# Patient Record
Sex: Male | Born: 2000 | Race: Black or African American | Hispanic: No | Marital: Single | Smoking: Former smoker
Health system: Southern US, Community
[De-identification: ages and names within clinical notes are randomized; demographics above are authoritative.]

## PROBLEM LIST (undated history)

## (undated) ENCOUNTER — Ambulatory Visit: Source: Home / Self Care

## (undated) ENCOUNTER — Ambulatory Visit: Admission: EM | Source: Home / Self Care

---
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
## 2018-04-07 ENCOUNTER — Encounter (HOSPITAL_COMMUNITY): Payer: Self-pay | Admitting: Emergency Medicine

## 2018-04-07 ENCOUNTER — Emergency Department (HOSPITAL_COMMUNITY)
Admission: EM | Admit: 2018-04-07 | Discharge: 2018-04-07 | Disposition: A | Discharge status: Home / Self Care | Payer: Medicaid Other | Attending: Emergency Medicine | Admitting: Emergency Medicine

## 2018-04-07 ENCOUNTER — Emergency Department (HOSPITAL_COMMUNITY): Payer: Medicaid Other

## 2018-04-07 DIAGNOSIS — Y999 Unspecified external cause status: Secondary | ICD-10-CM | POA: Diagnosis not present

## 2018-04-07 DIAGNOSIS — S6991XA Unspecified injury of right wrist, hand and finger(s), initial encounter: Secondary | ICD-10-CM | POA: Diagnosis present

## 2018-04-07 DIAGNOSIS — S62346A Nondisplaced fracture of base of fifth metacarpal bone, right hand, initial encounter for closed fracture: Secondary | ICD-10-CM | POA: Insufficient documentation

## 2018-04-07 DIAGNOSIS — S62339A Displaced fracture of neck of unspecified metacarpal bone, initial encounter for closed fracture: Secondary | ICD-10-CM

## 2018-04-07 DIAGNOSIS — Y929 Unspecified place or not applicable: Secondary | ICD-10-CM | POA: Diagnosis not present

## 2018-04-07 DIAGNOSIS — Y9389 Activity, other specified: Secondary | ICD-10-CM | POA: Insufficient documentation

## 2018-04-07 DIAGNOSIS — F17210 Nicotine dependence, cigarettes, uncomplicated: Secondary | ICD-10-CM | POA: Insufficient documentation

## 2018-04-07 DIAGNOSIS — W2209XA Striking against other stationary object, initial encounter: Secondary | ICD-10-CM | POA: Insufficient documentation

## 2018-04-07 MED ORDER — HYDROCODONE-ACETAMINOPHEN 5-325 MG PO TABS: 1.0000 | ORAL_TABLET | Freq: Two times a day (BID) | ORAL | 0 refills | Status: DC

## 2018-04-07 MED ORDER — IBUPROFEN 400 MG PO TABS
400.0000 mg | ORAL_TABLET | Freq: Once | ORAL | Status: AC | PRN
Start: 2018-04-07 — End: 2018-04-07
  Administered 2018-04-07: 400 mg via ORAL
  Filled 2018-04-07: qty 1

## 2018-04-07 MED ORDER — NAPROXEN 500 MG PO TABS: 500.0000 mg | ORAL_TABLET | Freq: Two times a day (BID) | ORAL | 0 refills | Status: DC

## 2018-04-07 NOTE — ED Triage Notes (Addendum)
Pt states that yesterday he punched a locker, injuring his right hand. Swelling and redness noted to the right hand, pulses 3+. Pt took tylenol last night.

## 2018-04-07 NOTE — Discharge Instructions (Addendum)
Ice to hand (over splint) 30 minutes 4xday  No pushing, pulling, lifting with right hand.  Keep splint intact and dry.

## 2018-04-07 NOTE — Consult Note (Addendum)
Reason for Consult:[facility] Referring Physician: J Hedges  Ryan Esparza is an 17 y.o. male.  HPI: Ryan Esparza was at school and punched a locker with his right hand. About 10 minutes later it started hurting. He tried to tough it out but when it was still hurting this morning he came to the ED for evaluation. X-rays showed a 5th MC base fx and hand surgery was consulted. He c/o localized pain and some numbness in the little finger. He is [name] and works at a bar.  History reviewed. No pertinent past medical history.  No past surgical history on file.  Family History  Problem Relation Age of Onset  . Obesity Father     Social History:  reports that he has been smoking MJ (no tobacco).  He has never used smokeless tobacco. He reports that he does not drink alcohol.  Allergies: No Known Allergies  Medications: I have reviewed the patient's current medications.  No results found for this or any previous visit (from the past 48 hour(s)).  Dg Hand Complete Right  Result Date: 04/07/2018 CLINICAL DATA:  Punching injury.  Pain of the metacarpal region. EXAM: RIGHT HAND - COMPLETE 3+ VIEW COMPARISON:  None. FINDINGS: Fracture of the base of the fifth metacarpal, intra-articular. No third or fourth metacarpal fracture identified. IMPRESSION: Fracture the base of the fifth metacarpal. Electronically Signed   By: Ryan Esparza M.D.   On: 04/07/2018 10:36    Review of Systems  Constitutional: Negative for weight loss.  HENT: Negative for ear discharge, ear pain, hearing loss and tinnitus.   Eyes: Negative for blurred vision, double vision, photophobia and pain.  Respiratory: Negative for cough, sputum production and shortness of breath.   Cardiovascular: Negative for chest pain.  Gastrointestinal: Negative for abdominal pain, nausea and vomiting.  Genitourinary: Negative for dysuria, flank pain, frequency and urgency.  Musculoskeletal: Positive for joint pain (Right hand). Negative for back pain,  falls, myalgias and neck pain.  Neurological: Negative for dizziness, tingling, sensory change, focal weakness, loss of consciousness and headaches.  Endo/Heme/Allergies: Does not bruise/bleed easily.  Psychiatric/Behavioral: Negative for depression, memory loss and substance abuse. The patient is not nervous/anxious.    Blood pressure 116/66, pulse (!) 56, temperature 98.3 F (36.8 C), temperature source Oral, resp. rate 18, height 5\' 5"  (1.651 m), weight 56.7 kg (125 lb), SpO2 100 %. Physical Exam  Constitutional: He appears well-developed and well-nourished. No distress.  HENT:  Head: Normocephalic and atraumatic.  Eyes: Conjunctivae are normal. Right eye exhibits no discharge. Left eye exhibits no discharge. No scleral icterus.  Neck: Normal range of motion.  Cardiovascular: Normal rate and regular rhythm.  Respiratory: Effort normal. No respiratory distress.  Musculoskeletal:  Right shoulder, elbow, wrist, digits- no skin wounds, severe TTP base of 5th MC, mild overlying erythema, no instability, no blocks to motion  Sens  Ax/R/M intact, U paresthetic  Mot   Ax/ R/ PIN/ M/ AIN/ U intact  Rad 2+  Neurological: He is alert.  Skin: Skin is warm and dry. He is not diaphoretic.  Psychiatric: He has a normal mood and affect. His behavior is normal.    Assessment/Plan: Right 5th MC fx -- I suspect the N/T is just due to the swelling and should resolve with time. Splint and f/u with Dr. Dion Esparza in office in 7-10d.     Ryan CaldronMichael J. Jeffery, PA-C Orthopedic Surgery [phone] 04/07/2018, 1:03 PM   Films reviewed, agree with above.  Will see as an outpatient.  Not likely to need surgery.

## 2018-04-07 NOTE — ED Provider Notes (Signed)
[facility] EMERGENCY DEPARTMENT Provider Note   CSN: [id number] Arrival date & time: 04/07/18  4098     History   Chief Complaint Chief Complaint  Patient presents with  . Hand Injury    HPI Jahaan Vanwagner is a 17 y.o. male.  HPI   17 year old male presents with hand pain.  Patient reports yesterday he punched a locker causing pain to the base of his fifth metacarpal.  He notes associated swelling.  He reports some decreased sensation in the fourth and fifth fingers, reduced range of motion secondary to pain.  Patient denies any other injuries.  He notes he is right-hand dominant currently in high school and working.  Patient received ibuprofen prior to my evaluation which improved his symptoms.    History reviewed. No pertinent past medical history.  Patient Active Problem List   Diagnosis Date Noted  . Substance use disorder 09/11/2017  . Bereavement 09/11/2017  . Acne 09/13/2013    No past surgical history on file.      Home Medications    Prior to Admission medications   Medication Sig Start Date End Date Taking? Authorizing Provider  HYDROcodone-acetaminophen (NORCO/VICODIN) 5-325 MG tablet Take 1 tablet by mouth 2 (two) times daily. 04/07/18   Greyson Peavy, Tinnie Gens, PA-C  hydrocortisone (ANUSOL-HC) 2.5 % rectal cream Apply rectally 2 times daily 11/10/17   Alazar Cherian, Tinnie Gens, PA-C  naproxen (NAPROSYN) 500 MG tablet Take 1 tablet (500 mg total) by mouth 2 (two) times daily. 04/07/18   Lorissa Kishbaugh, Tinnie Gens, PA-C  tretinoin (RETIN-A) 0.025 % cream Apply topically at bedtime. Patient not taking: Reported on 12/28/2014 09/13/13   Brynda Greathouse, MD    Family History Family History  Problem Relation Age of Onset  . Obesity Father     Social History Social History   Tobacco Use  . Smoking status: Current Every Day Smoker  . Smokeless tobacco: Never Used  Substance Use Topics  . Alcohol use: No  . Drug use: Not on file     Allergies   Patient has no  known allergies.   Review of Systems Review of Systems   Physical Exam Updated Vital Signs BP (!) 127/59 (BP Location: Left Arm)   Pulse 86   Temp 98.4 F (36.9 C) (Oral)   Resp 20   Ht 5\' 5"  (1.651 m)   Wt 56.7 kg (125 lb)   SpO2 98%   BMI 20.80 kg/m   Physical Exam  Constitutional: He is oriented to person, place, and time. He appears well-developed and well-nourished.  HENT:  Head: Normocephalic and atraumatic.  Eyes: Pupils are equal, round, and reactive to light. Conjunctivae are normal. Right eye exhibits no discharge. Left eye exhibits no discharge. No scleral icterus.  Neck: Normal range of motion. No JVD present. No tracheal deviation present.  Pulmonary/Chest: Effort normal. No stridor.  Musculoskeletal:  Swelling noted at the dorsal proximal fifth metacarpal-decreased sensation to the fourth and fifth metacarpals, strength is intact but reduced in the fourth and fifth reduced range of motion secondary to pain-remainder of hand atraumatic with intact sensory motor function  Neurological: He is alert and oriented to person, place, and time. Coordination normal.  Psychiatric: He has a normal mood and affect. His behavior is normal. Judgment and thought content normal.  Nursing note and vitals reviewed.    ED Treatments / Results  Labs (all labs ordered are listed, but only abnormal results are displayed) Labs Reviewed - No data to display  EKG None  Radiology Dg Hand Complete Right  Result Date: 04/07/2018 CLINICAL DATA:  Punching injury.  Pain of the metacarpal region. EXAM: RIGHT HAND - COMPLETE 3+ VIEW COMPARISON:  None. FINDINGS: Fracture of the base of the fifth metacarpal, intra-articular. No third or fourth metacarpal fracture identified. IMPRESSION: Fracture the base of the fifth metacarpal. Electronically Signed   By: Paulina FusiMark  Shogry M.D.   On: 04/07/2018 10:36    Procedures Procedures (including critical care time)  SPLINT APPLICATION Date/Time:  3:58 PM Authorized by: Kelle DartingJeffrey Todd Debrah Granderson Consent: Verbal consent obtained. Risks and benefits: risks, benefits and alternatives were discussed Consent given by: patient Splint applied by: orthopedic technician Location details: right hand Splint type: plaster Supplies used: plaster  Post-procedure: The splinted body part was neurovascularly unchanged following the procedure. Patient tolerance: Patient tolerated the procedure well with no immediate complications.    Medications Ordered in ED Medications  ibuprofen (ADVIL,MOTRIN) tablet 400 mg (400 mg Oral Given 04/07/18 1005)     Initial Impression / Assessment and Plan / ED Course  I have reviewed the triage vital signs and the nursing notes.  Pertinent labs & imaging results that were available during my care of the patient were reviewed by me and considered in my medical decision making (see chart for details).     Final Clinical Impressions(s) / ED Diagnoses   Final diagnoses:  Closed boxer's fracture, initial encounter    Labs:   Imaging: DG hand complete right  Consults: Orthopedics  Therapeutics:  Discharge Meds: Hydrocodone  Assessment/Plan: 17 year old male presents today with metacarpal fracture.  Surgery consulted splint placed patient will follow-up as an outpatient.  No complicating features here.  Strict return precautions given.  Patient verbalized understanding and agreement to today's plan.      ED Discharge Orders        Ordered    HYDROcodone-acetaminophen (NORCO/VICODIN) 5-325 MG tablet  2 times daily     04/07/18 1308    naproxen (NAPROSYN) 500 MG tablet  2 times daily     04/07/18 1308       Eyvonne MechanicHedges, Slayton Lubitz, PA-C 04/07/18 1559    Gerhard MunchLockwood, Robert, MD 04/08/18 [phone]

## 2018-04-19 DIAGNOSIS — S62304A Unspecified fracture of fourth metacarpal bone, right hand, initial encounter for closed fracture: Secondary | ICD-10-CM | POA: Diagnosis not present

## 2018-04-19 DIAGNOSIS — S62306A Unspecified fracture of fifth metacarpal bone, right hand, initial encounter for closed fracture: Secondary | ICD-10-CM | POA: Diagnosis not present

## 2019-10-19 ENCOUNTER — Other Ambulatory Visit: Payer: Self-pay

## 2019-10-19 DIAGNOSIS — R310 Gross hematuria: Secondary | ICD-10-CM | POA: Insufficient documentation

## 2019-10-19 DIAGNOSIS — F17200 Nicotine dependence, unspecified, uncomplicated: Secondary | ICD-10-CM | POA: Insufficient documentation

## 2019-10-19 DIAGNOSIS — Z79899 Other long term (current) drug therapy: Secondary | ICD-10-CM | POA: Diagnosis not present

## 2019-10-19 DIAGNOSIS — R319 Hematuria, unspecified: Secondary | ICD-10-CM | POA: Diagnosis not present

## 2019-10-20 ENCOUNTER — Encounter (HOSPITAL_COMMUNITY): Payer: Self-pay | Admitting: Emergency Medicine

## 2019-10-20 ENCOUNTER — Emergency Department (HOSPITAL_COMMUNITY): Payer: Medicaid Other

## 2019-10-20 ENCOUNTER — Other Ambulatory Visit: Payer: Self-pay

## 2019-10-20 ENCOUNTER — Emergency Department (HOSPITAL_COMMUNITY)
Admission: EM | Admit: 2019-10-20 | Discharge: 2019-10-20 | Disposition: A | Discharge status: Home / Self Care | Payer: Medicaid Other | Attending: Emergency Medicine | Admitting: Emergency Medicine

## 2019-10-20 DIAGNOSIS — R319 Hematuria, unspecified: Secondary | ICD-10-CM | POA: Diagnosis not present

## 2019-10-20 DIAGNOSIS — R310 Gross hematuria: Secondary | ICD-10-CM

## 2019-10-20 LAB — CBC WITH DIFFERENTIAL/PLATELET
Abs Immature Granulocytes: 0.02 10*3/uL (ref 0.00–0.07)
Basophils Absolute: 0 10*3/uL (ref 0.0–0.1)
Basophils Relative: 0 %
Eosinophils Absolute: 0.2 10*3/uL (ref 0.0–0.5)
Eosinophils Relative: 2 %
HCT: 43.7 % (ref 39.0–52.0)
Hemoglobin: 14.6 g/dL (ref 13.0–17.0)
Immature Granulocytes: 0 %
Lymphocytes Relative: 36 %
Lymphs Abs: 3.1 10*3/uL (ref 0.7–4.0)
MCH: 30.8 pg (ref 26.0–34.0)
MCHC: 33.4 g/dL (ref 30.0–36.0)
MCV: 92.2 fL (ref 80.0–100.0)
Monocytes Absolute: 0.4 10*3/uL (ref 0.1–1.0)
Monocytes Relative: 5 %
Neutro Abs: 5 10*3/uL (ref 1.7–7.7)
Neutrophils Relative %: 57 %
Platelets: 179 10*3/uL (ref 150–400)
RBC: 4.74 MIL/uL (ref 4.22–5.81)
RDW: 14.2 % (ref 11.5–15.5)
WBC: 8.7 10*3/uL (ref 4.0–10.5)
nRBC: 0 % (ref 0.0–0.2)

## 2019-10-20 LAB — BASIC METABOLIC PANEL
Anion gap: 9 (ref 5–15)
BUN: 14 mg/dL (ref 6–20)
CO2: 26 mmol/L (ref 22–32)
Calcium: 8.6 mg/dL — ABNORMAL LOW (ref 8.9–10.3)
Chloride: 106 mmol/L (ref 98–111)
Creatinine, Ser: 1 mg/dL (ref 0.61–1.24)
GFR calc Af Amer: >60 mL/min (ref >60)
GFR calc non Af Amer: >60 mL/min (ref >60)
Glucose, Bld: 64 mg/dL — ABNORMAL LOW (ref 70–99)
Potassium: 3.7 mmol/L (ref 3.5–5.1)
Sodium: 141 mmol/L (ref 135–145)

## 2019-10-20 LAB — URINALYSIS, ROUTINE W REFLEX MICROSCOPIC
Bilirubin Urine: NEGATIVE
Glucose, UA: NEGATIVE mg/dL
Ketones, ur: NEGATIVE mg/dL
Leukocytes,Ua: NEGATIVE
Nitrite: NEGATIVE
Protein, ur: 30 mg/dL — AB
RBC / HPF: >50 RBC/hpf — ABNORMAL HIGH (ref 0–5)
Specific Gravity, Urine: 1.019 (ref 1.005–1.030)
pH: 7 (ref 5.0–8.0)

## 2019-10-20 NOTE — ED Triage Notes (Signed)
Pt reports hematuria that started yesterday. No blood clots. Denies any injuries or pain. No other sx.

## 2019-10-20 NOTE — ED Notes (Signed)
Patient transported to Ultrasound 

## 2019-10-20 NOTE — ED Notes (Signed)
Pt returned from US

## 2019-10-20 NOTE — ED Provider Notes (Signed)
[facility] EMERGENCY DEPARTMENT Provider Note   CSN: [id number] Arrival date & time: 10/19/19  2353     History   Chief Complaint Chief Complaint  Patient presents with  . Hematuria    HPI Josede Cicero is a 18 y.o. male.     Pt states he has had red urine with each void since yesterday.  Denies dysuria, back, or abd pain.  Denies recent illness or fever.   The history is provided by the patient.  Hematuria This is a new problem. The current episode started yesterday. The problem has been unchanged. Pertinent negatives include no abdominal pain, anorexia, change in bowel habit, coughing, fever, myalgias, rash, sore throat or vomiting. Nothing aggravates the symptoms. He has tried nothing for the symptoms.    History reviewed. No pertinent past medical history.  Patient Active Problem List   Diagnosis Date Noted  . Substance use disorder 09/11/2017  . Bereavement 09/11/2017  . Acne 09/13/2013    History reviewed. No pertinent surgical history.      Home Medications    Prior to Admission medications   Medication Sig Start Date End Date Taking? Authorizing Provider  HYDROcodone-acetaminophen (NORCO/VICODIN) 5-325 MG tablet Take 1 tablet by mouth 2 (two) times daily. 04/07/18   Hedges, Dellis Filbert, PA-C  hydrocortisone (ANUSOL-HC) 2.5 % rectal cream Apply rectally 2 times daily 11/10/17   Hedges, Dellis Filbert, PA-C  naproxen (NAPROSYN) 500 MG tablet Take 1 tablet (500 mg total) by mouth 2 (two) times daily. 04/07/18   Hedges, Dellis Filbert, PA-C  tretinoin (RETIN-A) 0.025 % cream Apply topically at bedtime. Patient not taking: Reported on 12/28/2014 09/13/13   Vidal Schwalbe, MD    Family History Family History  Problem Relation Age of Onset  . Obesity Father     Social History Social History   Tobacco Use  . Smoking status: Current Every Day Smoker  . Smokeless tobacco: Never Used  Substance Use Topics  . Alcohol use: No  . Drug use: Not on file      Allergies   Patient has no known allergies.   Review of Systems Review of Systems  Constitutional: Negative for fever.  HENT: Negative for sore throat.   Respiratory: Negative for cough.   Gastrointestinal: Negative for abdominal pain, anorexia, change in bowel habit and vomiting.  Genitourinary: Positive for hematuria.  Musculoskeletal: Negative for myalgias.  Skin: Negative for rash.     Physical Exam Updated Vital Signs BP 100/62 (BP Location: Right Arm)   Pulse 74   Temp 98.3 F (36.8 C) (Oral)   Resp 18   Ht 5\' 5"  (1.651 m)   Wt 54.4 kg   SpO2 100%   BMI 19.97 kg/m   Physical Exam Vitals signs and nursing note reviewed.  Constitutional:      General: He is not in acute distress.    Appearance: Normal appearance.  HENT:     Head: Normocephalic and atraumatic.     Nose: Nose normal.     Mouth/Throat:     Mouth: Mucous membranes are moist.     Pharynx: Oropharynx is clear.  Eyes:     Extraocular Movements: Extraocular movements intact.     Conjunctiva/sclera: Conjunctivae normal.  Neck:     Musculoskeletal: Normal range of motion.  Cardiovascular:     Rate and Rhythm: Normal rate and regular rhythm.     Pulses: Normal pulses.     Heart sounds: Normal heart sounds.  Pulmonary:     Effort: Pulmonary  effort is normal.     Breath sounds: Normal breath sounds.  Abdominal:     General: Bowel sounds are normal. There is no distension.     Palpations: Abdomen is soft.     Tenderness: There is no abdominal tenderness. There is no right CVA tenderness or left CVA tenderness.  Genitourinary:    Penis: Normal.      Scrotum/Testes: Normal.  Musculoskeletal: Normal range of motion.  Skin:    General: Skin is warm and dry.     Capillary Refill: Capillary refill takes less than 2 seconds.     Findings: No rash.  Neurological:     General: No focal deficit present.     Mental Status: He is alert.     Coordination: Coordination normal.     Gait: Gait normal.       ED Treatments / Results  Labs (all labs ordered are listed, but only abnormal results are displayed) Labs Reviewed  URINALYSIS, ROUTINE W REFLEX MICROSCOPIC - Abnormal; Notable for the following components:      Result Value   Color, Urine AMBER (*)    APPearance CLOUDY (*)    Hgb urine dipstick LARGE (*)    Protein, ur 30 (*)    RBC / HPF >50 (*)    Bacteria, UA RARE (*)    All other components within normal limits  BASIC METABOLIC PANEL - Abnormal; Notable for the following components:   Glucose, Bld 64 (*)    Calcium 8.6 (*)    All other components within normal limits  CBC WITH DIFFERENTIAL/PLATELET  ANTISTREPTOLYSIN O TITER  CBC WITH DIFFERENTIAL/PLATELET    EKG None  Radiology US Renal  Result Date: 10/20/2019 CLINICAL DATA:  18 year old male with hematuria. EXAM: RENAL / URINARY TRACT ULTRASOUND COMPLETE COMPARISON:  None. FINDINGS: Right Kidney: Renal measurements: 8.9 x 3.4 x 4.5 cm = volume: 72 mL. Normal echogenicity. No hydronephrosis or shadowing stone. Left Kidney: Renal measurements: 9.6 x 4.7 x 4.0 cm = volume: 97 mL. Normal echogenicity. No hydronephrosis or shadowing stone. Bladder: Appears normal for degree of bladder distention. Other: The prostate gland measures 3 cm in transverse diameter. IMPRESSION: Unremarkable renal ultrasound. Electronically Signed   By: Elgie Collard M.D.   On: 10/20/2019 02:49    Procedures Procedures (including critical care time)  Medications Ordered in ED Medications - No data to display   Initial Impression / Assessment and Plan / ED Course  I have reviewed the triage vital signs and the nursing notes.  Pertinent labs & imaging results that were available during my care of the patient were reviewed by me and considered in my medical decision making (see chart for details).        17 yom presenting w/ gross hematuria w/o other sx since yesterday.  No recent illness or fever to support post illness  glomerulonephritis.  No dysuria to suggest UTI, no abd or flank pain to suggest renal stone.  Normal exam w/ normal GU, abdomen soft, NTND, no flank or CP.  UA w/ gross hematuria.  Renal US, CBC, BMP normal.  Normotensive.  F/u info for urology provided.  Discussed supportive care as well need for f/u w/ PCP in 1-2 days.  Also discussed sx that warrant sooner re-eval in ED. Patient / Family / Caregiver informed of clinical course, understand medical decision-making process, and agree with plan.   Final Clinical Impressions(s) / ED Diagnoses   Final diagnoses:  Gross hematuria    ED Discharge  Orders    None       Viviano Simasobinson, Destine Ambroise, NP 10/20/19 0444    Nira Connardama, Pedro Eduardo, MD 10/20/19 [phone]

## 2019-10-22 LAB — ANTISTREPTOLYSIN O TITER: ASO: 110 IU/mL (ref 0.0–200.0)

## 2020-01-06 ENCOUNTER — Ambulatory Visit: Payer: Medicaid Other | Attending: Internal Medicine

## 2020-01-06 DIAGNOSIS — Z20822 Contact with and (suspected) exposure to covid-19: Secondary | ICD-10-CM | POA: Diagnosis not present

## 2020-01-07 LAB — NOVEL CORONAVIRUS, NAA: SARS-CoV-2, NAA: NOT DETECTED

## 2020-01-24 IMAGING — DX DG HAND COMPLETE 3+V*R*
3 series · 3 of 3 positions shown · non-contrast
Comparison: None.

CLINICAL DATA: Punching injury.  Pain of the metacarpal region.

EXAM:
RIGHT HAND - COMPLETE 3+ VIEW

[x hand pa right]
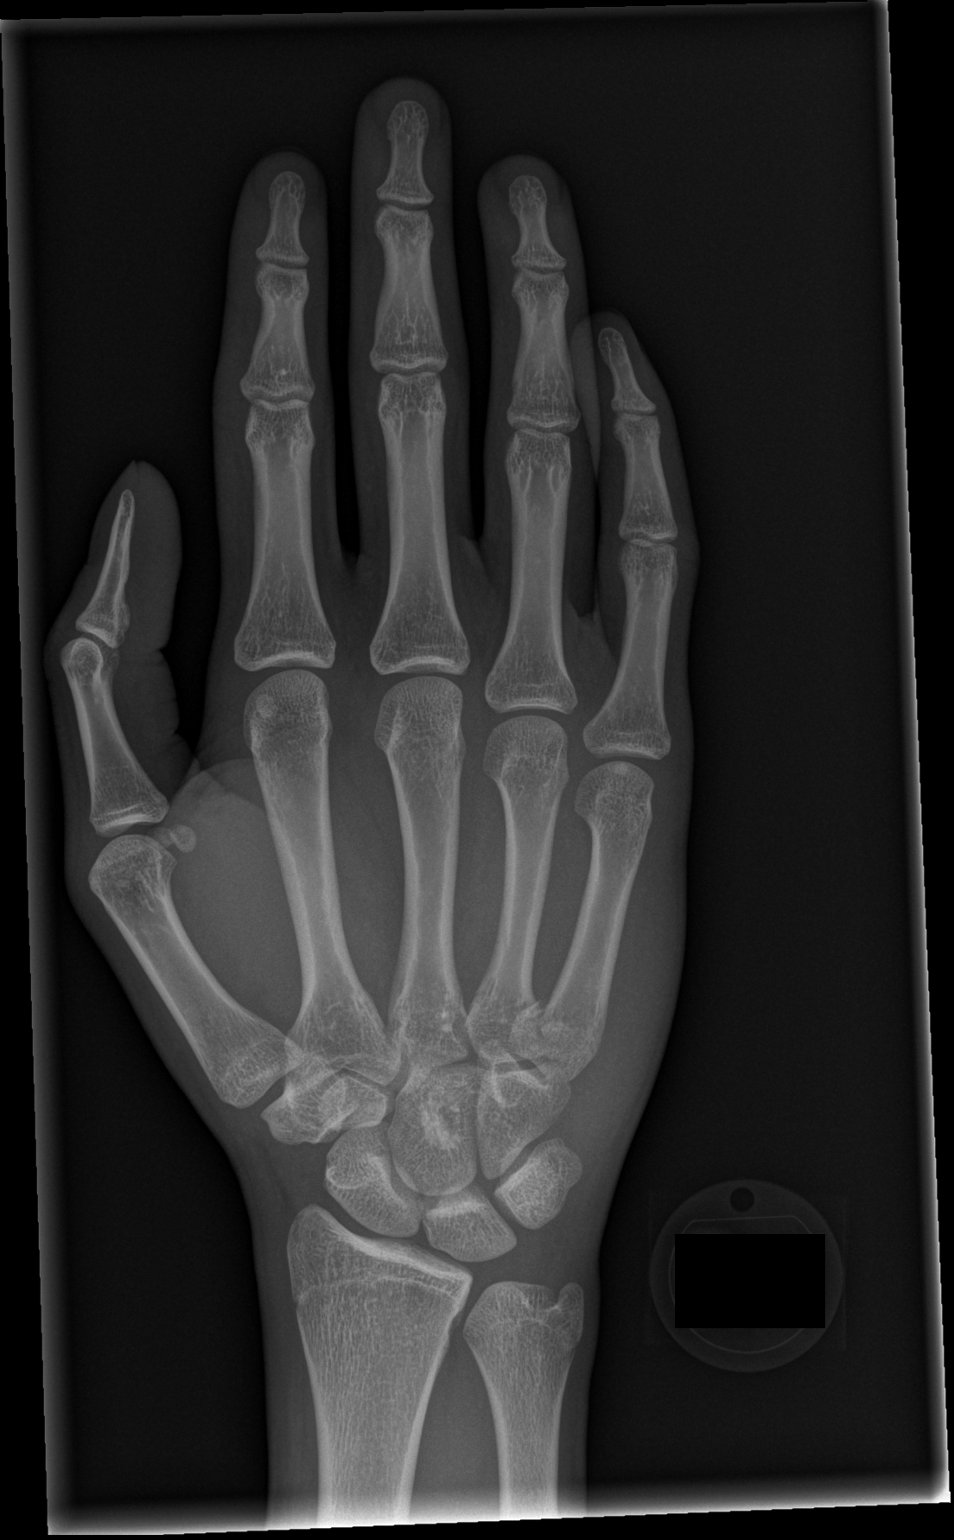

[x hand obl right]
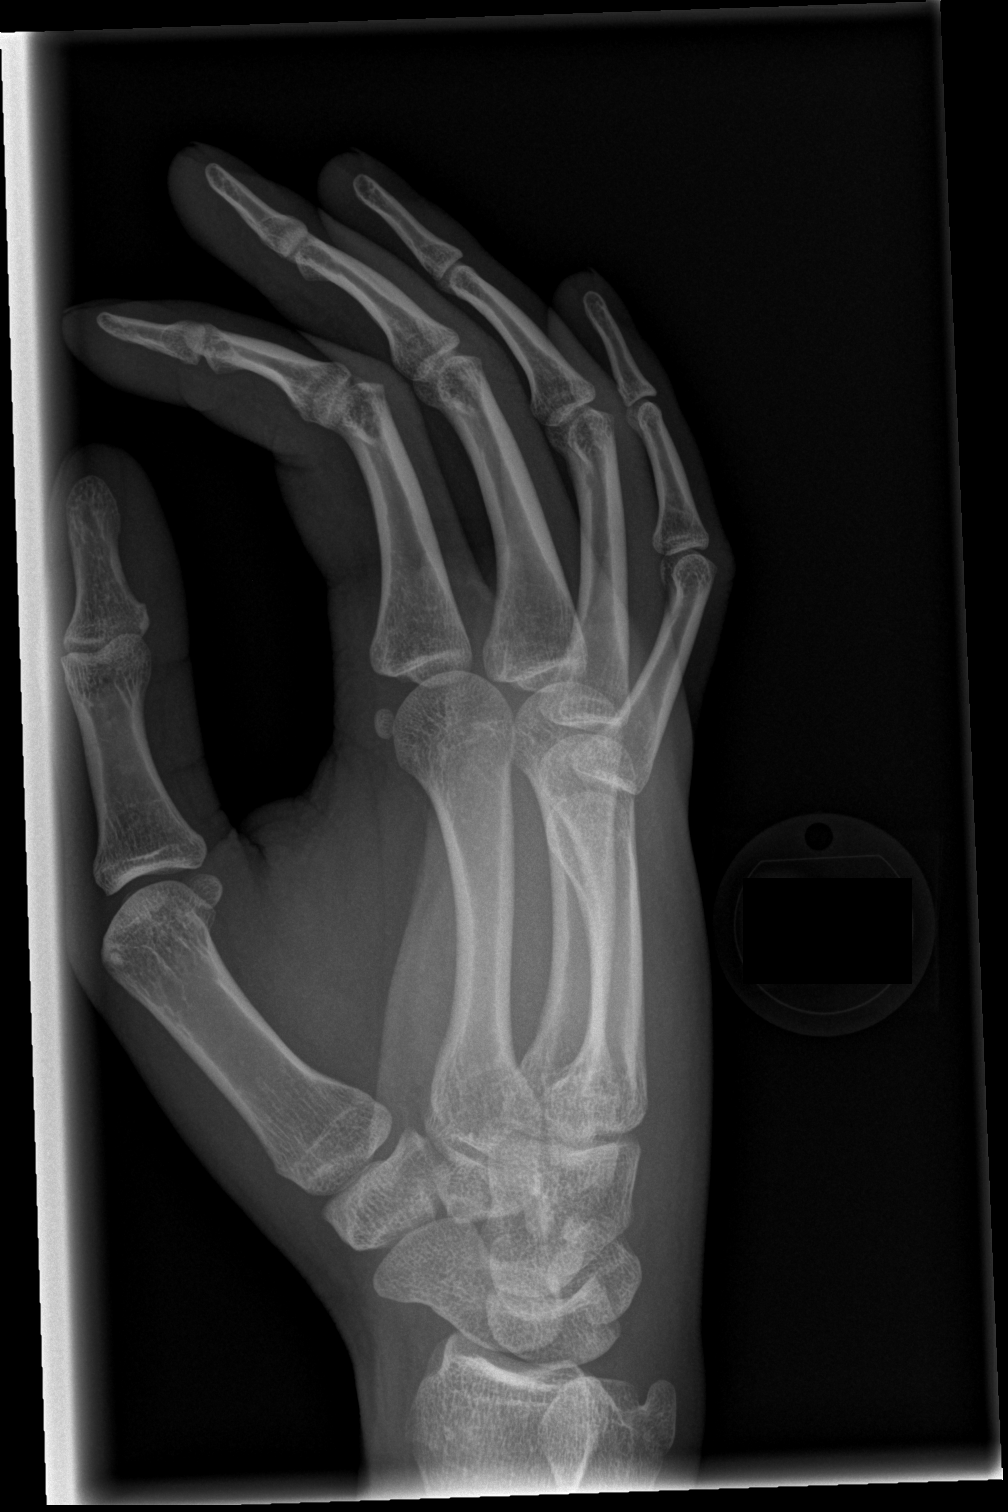

[x hand lat right]
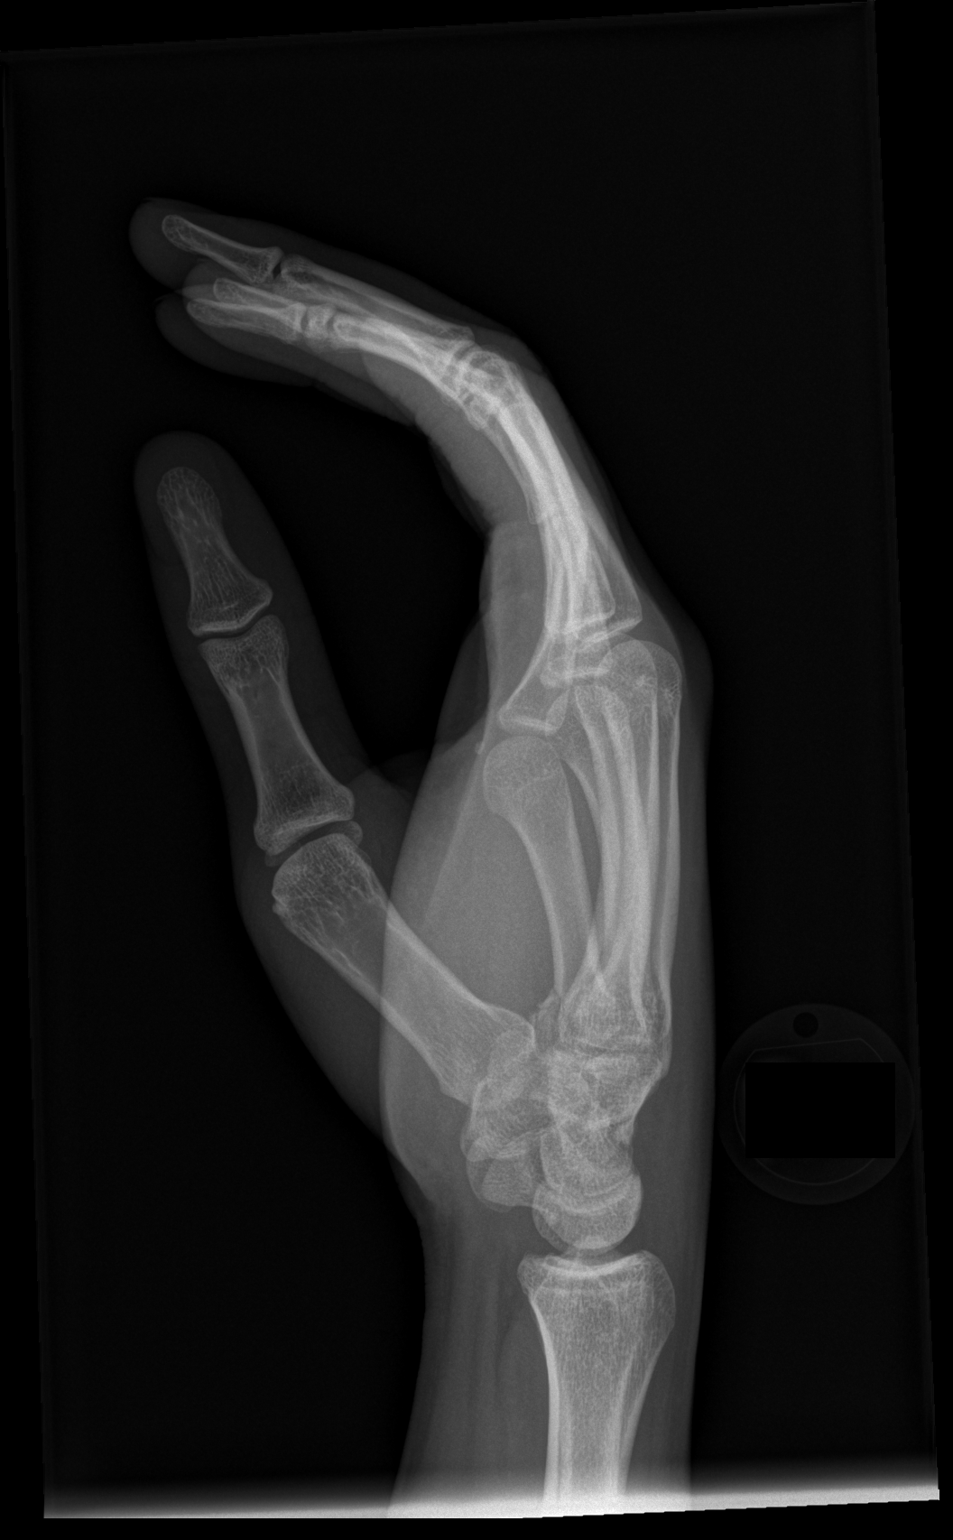

[3 of 3 positions shown; findings below may reference images not displayed]

FINDINGS: Fracture of the base of the fifth metacarpal, intra-articular. No
third or fourth metacarpal fracture identified.
IMPRESSION: Fracture the base of the fifth metacarpal.

## 2020-01-25 ENCOUNTER — Ambulatory Visit: Payer: Medicaid Other | Attending: Internal Medicine

## 2020-01-25 DIAGNOSIS — Z20822 Contact with and (suspected) exposure to covid-19: Secondary | ICD-10-CM

## 2020-01-26 LAB — NOVEL CORONAVIRUS, NAA: SARS-CoV-2, NAA: NOT DETECTED

## 2020-06-27 ENCOUNTER — Ambulatory Visit: Payer: Medicaid Other | Attending: Internal Medicine

## 2020-06-27 DIAGNOSIS — Z20822 Contact with and (suspected) exposure to covid-19: Secondary | ICD-10-CM

## 2020-06-28 LAB — SARS-COV-2, NAA 2 DAY TAT

## 2020-06-28 LAB — NOVEL CORONAVIRUS, NAA: SARS-CoV-2, NAA: NOT DETECTED

## 2021-08-07 IMAGING — US US RENAL
1 series · 14 of 25 positions shown · non-contrast
Comparison: None.

CLINICAL DATA: 17-year-old male with hematuria.

EXAM:
RENAL / URINARY TRACT ULTRASOUND COMPLETE

[Series 1: us renal · 14 of 28 slices shown]
[im 1/28]
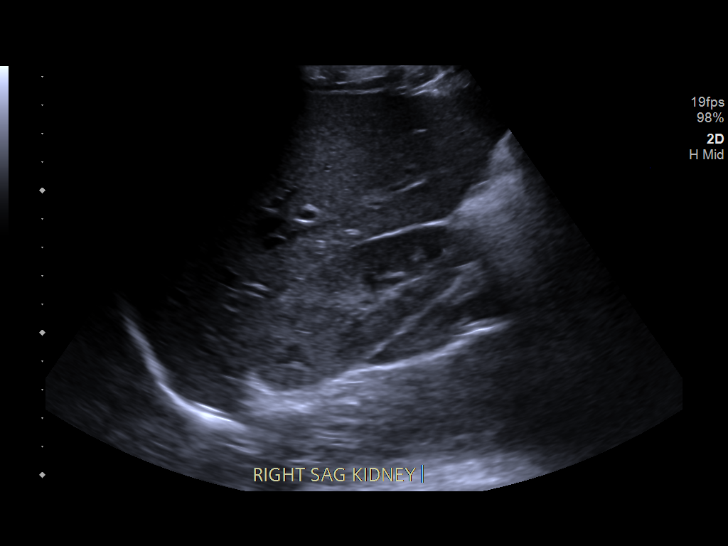
[im 3/28]
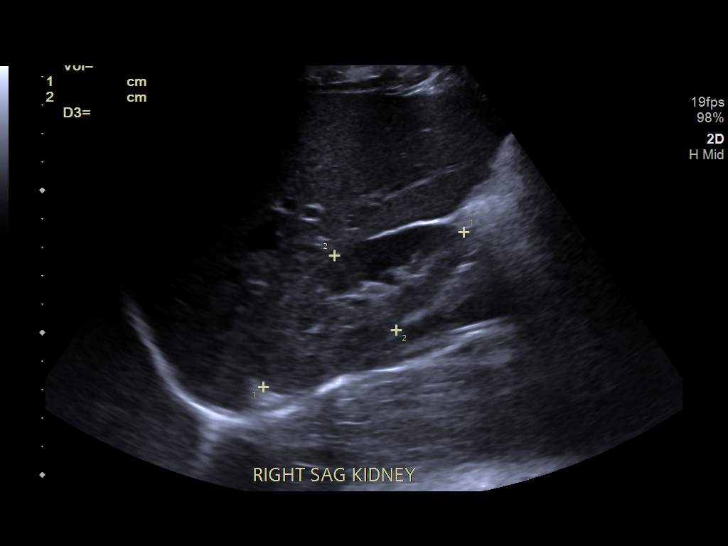
[im 5/28]
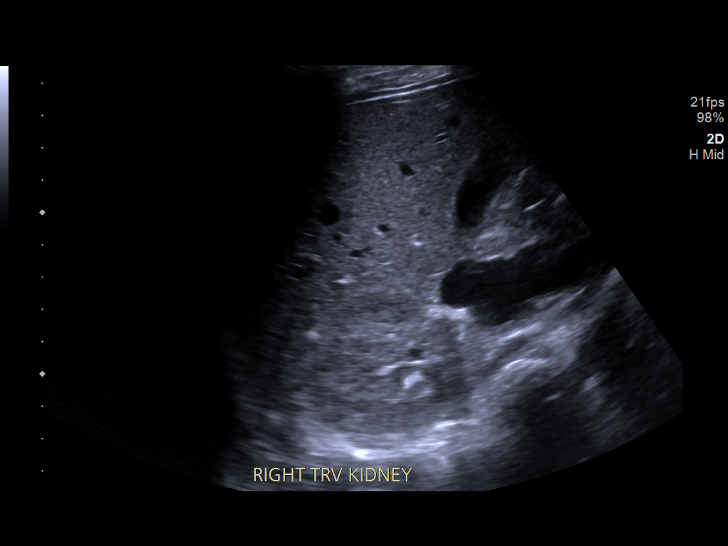
[im 7/28]
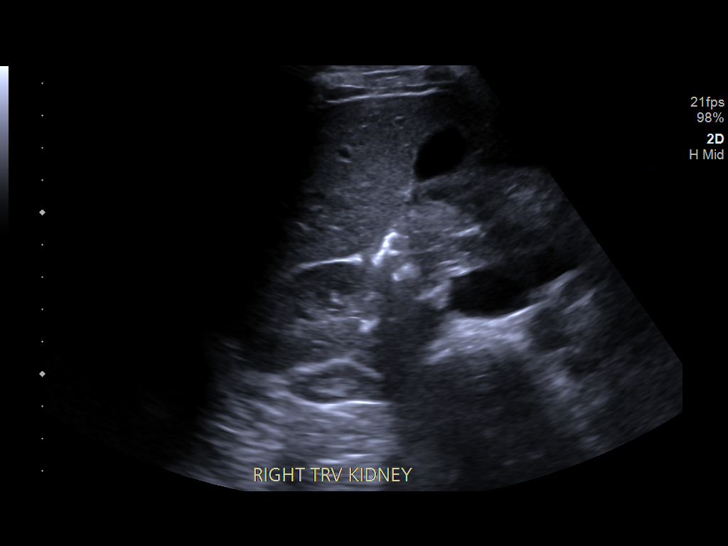
[im 10/28]
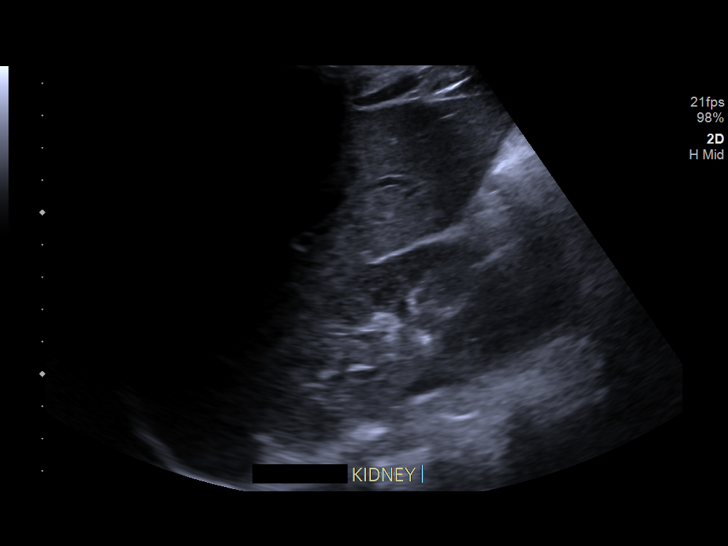
[im 11/28]
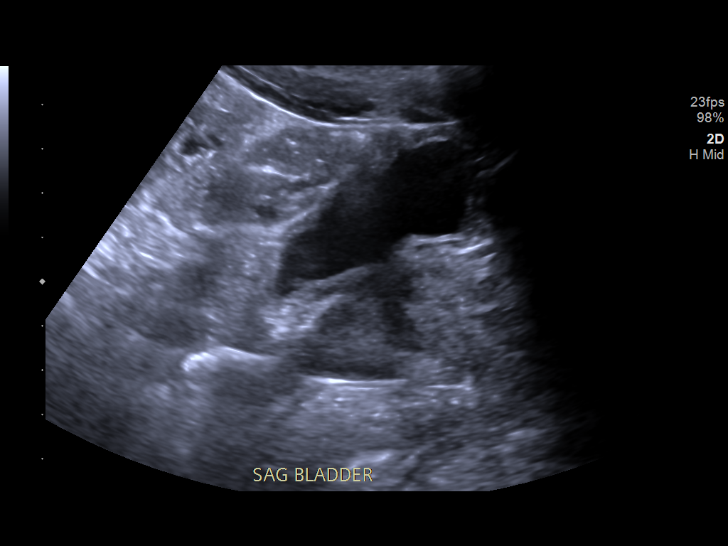
[im 13/28]
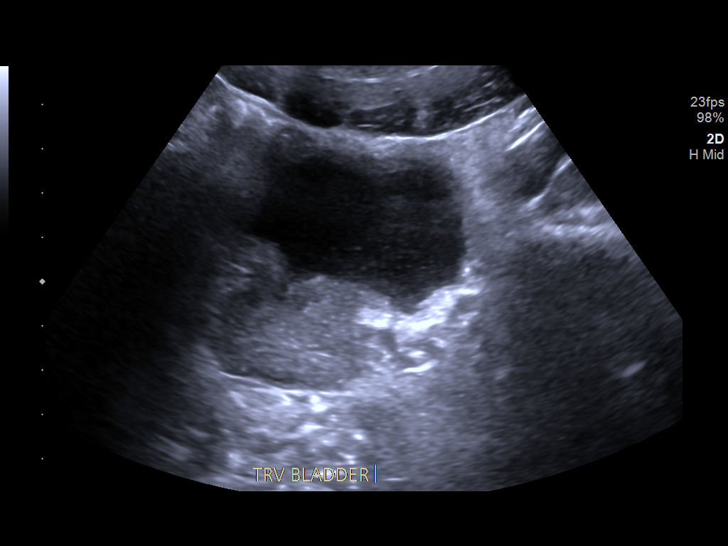
[im 15/28]
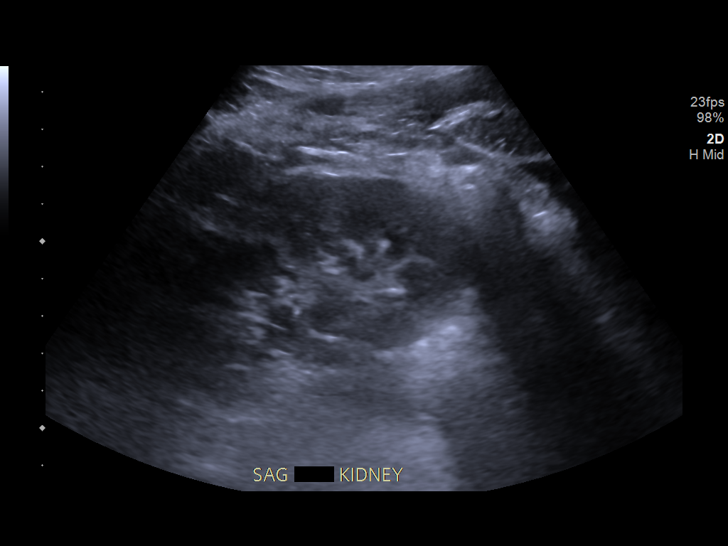
[im 17/28]
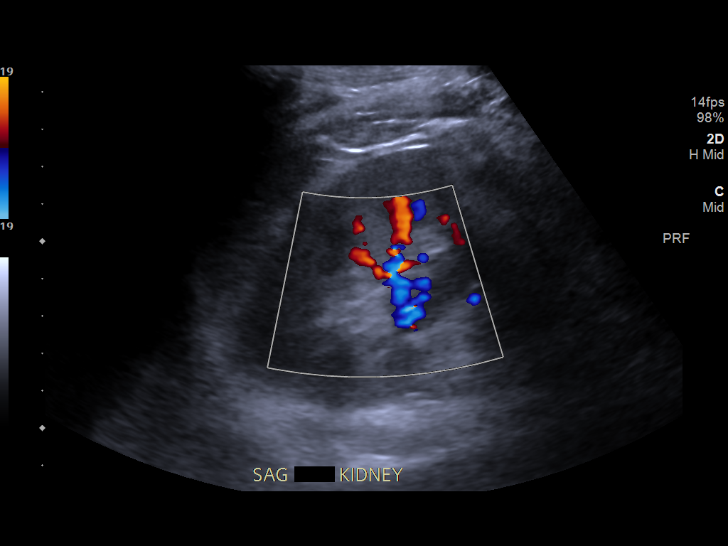
[im 19/28]
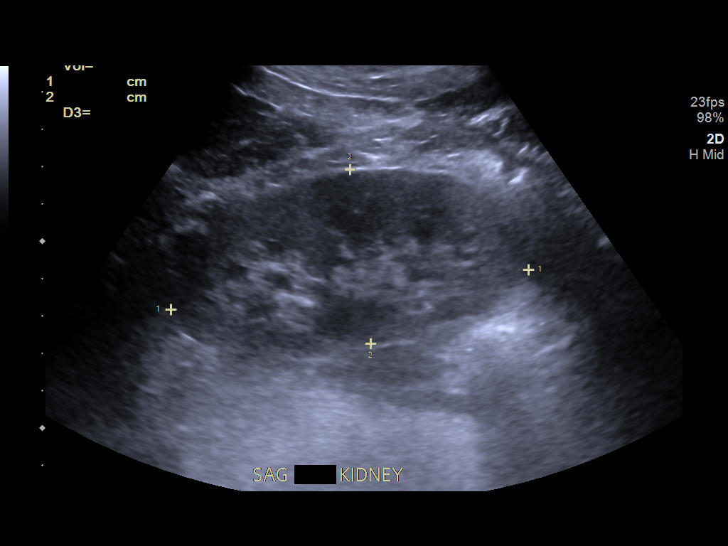
[im 21/28]
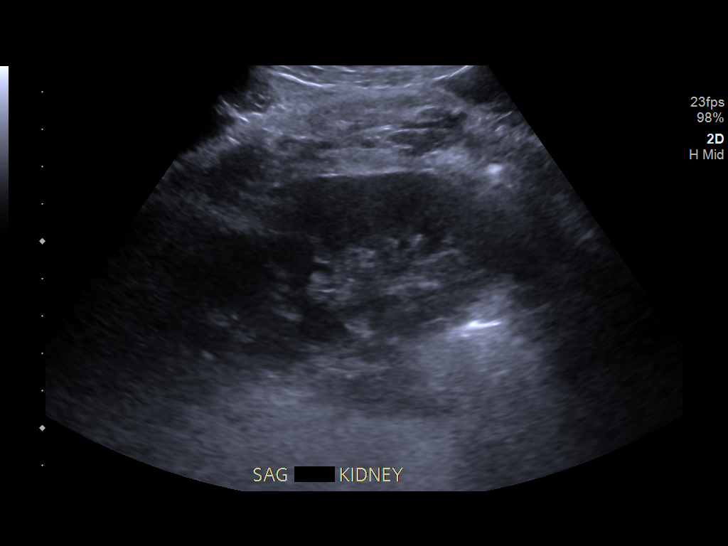
[im 23/28]
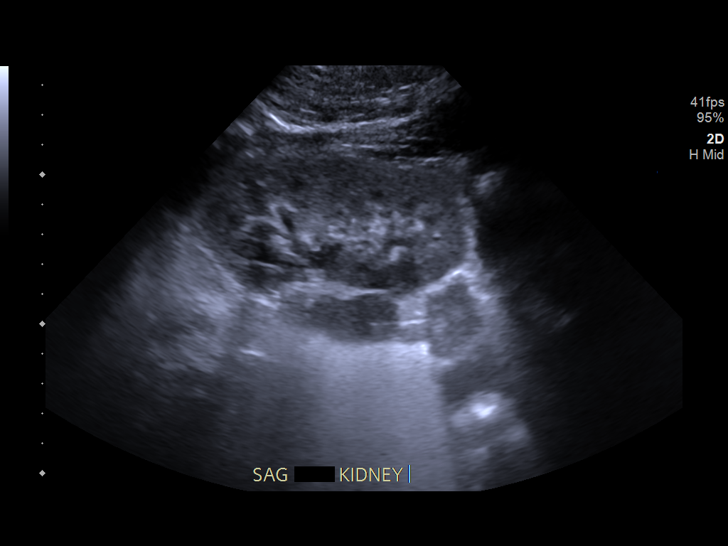
[im 25/28]
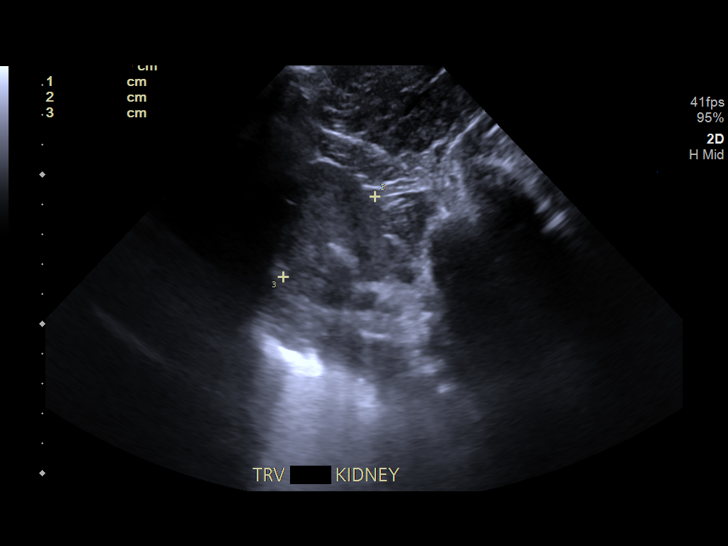
[im 28/28]
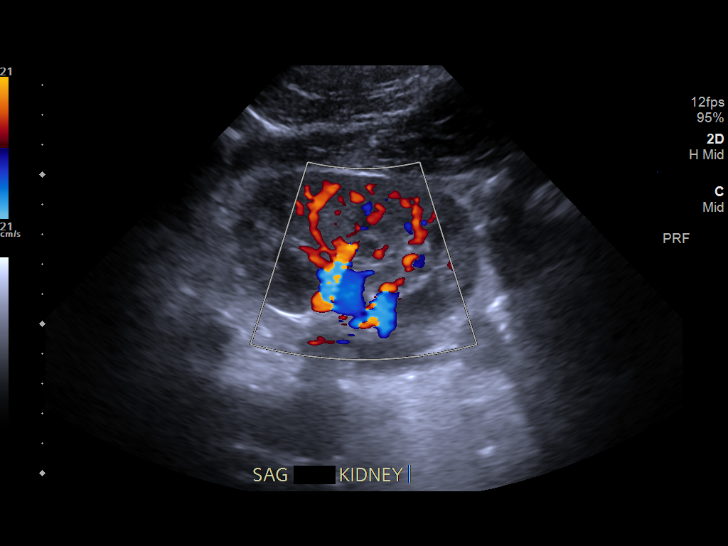

[14 of 25 positions shown; findings below may reference images not displayed]

FINDINGS: Right Kidney:

Renal measurements: 8.9 x 3.4 x 4.5 cm = volume: 72 mL. Normal
echogenicity. No hydronephrosis or shadowing stone.

Left Kidney:

Renal measurements: 9.6 x 4.7 x 4.0 cm = volume: 97 mL. Normal
echogenicity. No hydronephrosis or shadowing stone.

Bladder:

Appears normal for degree of bladder distention.

Other:

The prostate gland measures 3 cm in transverse diameter.
IMPRESSION: Unremarkable renal ultrasound.

## 2022-04-04 ENCOUNTER — Encounter: Payer: Self-pay | Admitting: Emergency Medicine

## 2022-04-04 ENCOUNTER — Ambulatory Visit
Admission: EM | Admit: 2022-04-04 | Discharge: 2022-04-04 | Disposition: A | Discharge status: Home / Self Care | Payer: 59 | Attending: Family Medicine | Admitting: Family Medicine

## 2022-04-04 DIAGNOSIS — Z202 Contact with and (suspected) exposure to infections with a predominantly sexual mode of transmission: Secondary | ICD-10-CM | POA: Insufficient documentation

## 2022-04-04 MED ORDER — CEFTRIAXONE SODIUM 500 MG IJ SOLR
500.0000 mg | Freq: Once | INTRAMUSCULAR | Status: AC
  Administered 2022-04-04: 500 mg via INTRAMUSCULAR

## 2022-04-04 NOTE — ED Provider Notes (Signed)
?[name] ? ? ? ?CSN: [id number] ?Arrival date & time: 04/04/22  0944 ? ? ?  ? ?History   ?Chief Complaint ?Chief Complaint  ?Patient presents with  ? Exposure to STD  ? ? ?HPI ?Tyquon Near is a 21 y.o. male.  ? ? ?Exposure to STD ? ?Here for being exposed to a sexually transmitted infection.  He states his partner just tested positive.  At first he could not tell me what was positive on her testing, but he did confirm that she had also seen [place] here this morning.  Therefore that she tested positive for gonorrhea was confirmed.  He is not allergic to any medicine, and has had no abdominal pain, fever, or dysuria or discharge. ? ?History reviewed. No pertinent past medical history. ? ?Patient Active Problem List  ? Diagnosis Date Noted  ? Substance use disorder 09/11/2017  ? Bereavement 09/11/2017  ? Acne 09/13/2013  ? ? ?History reviewed. No pertinent surgical history. ? ? ? ? ?Home Medications   ? ?Prior to Admission medications   ?Medication Sig Start Date End Date Taking? Authorizing Provider  ?hydrocortisone (ANUSOL-HC) 2.5 % rectal cream Apply rectally 2 times daily 11/10/17   Hedges, Tinnie Gens, PA-C  ?naproxen (NAPROSYN) 500 MG tablet Take 1 tablet (500 mg total) by mouth 2 (two) times daily. 04/07/18   Hedges, Tinnie Gens, PA-C  ?tretinoin (RETIN-A) 0.025 % cream Apply topically at bedtime. ?Patient not taking: Reported on 12/28/2014 09/13/13   Brynda Greathouse, MD  ? ? ?Family History ?Family History  ?Problem Relation Age of Onset  ? Obesity Father   ? ? ?Social History ?Social History  ? ?Tobacco Use  ? Smoking status: Every Day  ? Smokeless tobacco: Never  ?Vaping Use  ? Vaping Use: Some days  ?Substance Use Topics  ? Alcohol use: No  ? Drug use: Never  ? ? ? ?Allergies   ?Patient has no known allergies. ? ? ?Review of Systems ?Review of Systems ? ? ?Physical Exam ?Triage Vital Signs ?ED Triage Vitals [04/04/22 1022]  ?Enc Vitals Group  ?   BP 113/69  ?   Pulse Rate (!) 58  ?   Resp 18  ?   Temp 97.9 ?F  (36.6 ?C)  ?   Temp Source Oral  ?   SpO2 98 %  ?   Weight 150 lb (68 kg)  ?   Height 5\' 4"  (1.626 m)  ?   Head Circumference   ?   Peak Flow   ?   Pain Score 0  ?   Pain Loc   ?   Pain Edu?   ?   Excl. in GC?   ? ?No data found. ? ?Updated Vital Signs ?BP 113/69 (BP Location: Left Arm)   Pulse (!) 58   Temp 97.9 ?F (36.6 ?C) (Oral)   Resp 18   Ht 5\' 4"  (1.626 m)   Wt 68 kg   SpO2 98%   BMI 25.75 kg/m?  ? ?Visual Acuity ?Right Eye Distance:   ?Left Eye Distance:   ?Bilateral Distance:   ? ?Right Eye Near:   ?Left Eye Near:    ?Bilateral Near:    ? ?Physical Exam ?Vitals reviewed.  ?Constitutional:   ?   General: He is not in acute distress. ?   Appearance: He is not toxic-appearing.  ?Cardiovascular:  ?   Rate and Rhythm: Normal rate and regular rhythm.  ?   Heart sounds: No murmur heard. ?Pulmonary:  ?  Breath sounds: Normal breath sounds.  ?Skin: ?   Capillary Refill: Capillary refill takes less than 2 seconds.  ?   Coloration: Skin is not jaundiced or pale.  ?Neurological:  ?   Mental Status: He is alert and oriented to person, place, and time.  ?Psychiatric:     ?   Behavior: Behavior normal.  ? ? ? ?UC Treatments / Results  ?Labs ?(all labs ordered are listed, but only abnormal results are displayed) ?Labs Reviewed  ?CYTOLOGY, (ORAL, ANAL, URETHRAL) ANCILLARY ONLY  ? ? ?EKG ? ? ?Radiology ?No results found. ? ?Procedures ?Procedures (including critical care time) ? ?Medications Ordered in UC ?Medications  ?cefTRIAXone (ROCEPHIN) injection 500 mg (has no administration in time range)  ? ? ?Initial Impression / Assessment and Plan / UC Course  ?I have reviewed the triage vital signs and the nursing notes. ? ?Pertinent labs & imaging results that were available during my care of the patient were reviewed by me and considered in my medical decision making (see chart for details). ? ?  ? ?We will go ahead and treat with Rocephin for his exposure. ?Final Clinical Impressions(s) / UC Diagnoses  ? ?Final  diagnoses:  ?Exposure to STD  ? ? ? ?Discharge Instructions   ? ?  ?You have been given a shot of ceftriaxone 500 mg today ? ?Rest from intercourse for 1 to 2 weeks ? ? ? ? ?ED Prescriptions   ?None ?  ? ?PDMP not reviewed this encounter. ?  ?Zenia Resides, MD ?04/04/22 1047 ? ?

## 2022-04-04 NOTE — ED Triage Notes (Signed)
Patient states that his partner tested positive for STI, patient requesting treatment.  No sx's. ?

## 2022-04-04 NOTE — Discharge Instructions (Addendum)
You have been given a shot of ceftriaxone 500 mg today ? ?Rest from intercourse for 1 to 2 weeks ?

## 2022-04-07 LAB — CYTOLOGY, (ORAL, ANAL, URETHRAL) ANCILLARY ONLY
Chlamydia: POSITIVE — AB
Comment: NEGATIVE
Comment: NEGATIVE
Comment: NORMAL
Neisseria Gonorrhea: POSITIVE — AB
Trichomonas: NEGATIVE

## 2022-04-08 ENCOUNTER — Telehealth (HOSPITAL_COMMUNITY): Payer: Self-pay | Admitting: Emergency Medicine

## 2022-04-08 MED ORDER — DOXYCYCLINE HYCLATE 100 MG PO CAPS: 100.0000 mg | ORAL_CAPSULE | Freq: Two times a day (BID) | ORAL | 0 refills | Status: AC

## 2022-05-17 ENCOUNTER — Ambulatory Visit
Admission: RE | Admit: 2022-05-17 | Discharge: 2022-05-17 | Disposition: A | Discharge status: Home / Self Care | Payer: 59 | Source: Ambulatory Visit | Attending: Family Medicine | Admitting: Family Medicine

## 2022-05-17 VITALS — BP 118/68 | HR 51 | Temp 98.1°F | Resp 18

## 2022-05-17 DIAGNOSIS — Z113 Encounter for screening for infections with a predominantly sexual mode of transmission: Secondary | ICD-10-CM | POA: Diagnosis not present

## 2022-05-17 NOTE — Discharge Instructions (Addendum)
Staff will notify you of any positives on the swab 

## 2022-05-17 NOTE — ED Provider Notes (Addendum)
[name]    CSN: [id number] Arrival date & time: 05/17/22  0953      History   Chief Complaint Chief Complaint  Patient presents with   SEXUALLY TRANSMITTED DISEASE    Entered by patient   Appointment    HPI Ryan Esparza is a 21 y.o. male.   HPI Here for STD screening.  No dysuria or penile discharge or itching.  No abdominal pain or fever. He was treated for gonorrhea and chlamydia in April   History reviewed. No pertinent past medical history.  Patient Active Problem List   Diagnosis Date Noted   Substance use disorder 09/11/2017   Bereavement 09/11/2017   Acne 09/13/2013    History reviewed. No pertinent surgical history.     Home Medications    Prior to Admission medications   Not on File    Family History Family History  Problem Relation Age of Onset   Obesity Father     Social History Social History   Tobacco Use   Smoking status: Every Day   Smokeless tobacco: Never  Vaping Use   Vaping Use: Some days  Substance Use Topics   Alcohol use: No   Drug use: Never     Allergies   Patient has no known allergies.   Review of Systems Review of Systems   Physical Exam Triage Vital Signs ED Triage Vitals [05/17/22 1055]  Enc Vitals Group     BP 118/68     Pulse Rate (!) 51     Resp 18     Temp 98.1 F (36.7 C)     Temp Source Oral     SpO2 97 %     Weight      Height      Head Circumference      Peak Flow      Pain Score 0     Pain Loc      Pain Edu?      Excl. in GC?    No data found.  Updated Vital Signs BP 118/68 (BP Location: Right Arm)   Pulse (!) 51   Temp 98.1 F (36.7 C) (Oral)   Resp 18   SpO2 97%   Visual Acuity Right Eye Distance:   Left Eye Distance:   Bilateral Distance:    Right Eye Near:   Left Eye Near:    Bilateral Near:     Physical Exam Vitals reviewed.  Constitutional:      General: He is not in acute distress.    Appearance: He is not toxic-appearing.   Cardiovascular:     Rate and Rhythm: Normal rate and regular rhythm.  Skin:    Coloration: Skin is not pale.  Neurological:     Mental Status: He is alert and oriented to person, place, and time.  Psychiatric:        Behavior: Behavior normal.     UC Treatments / Results  Labs (all labs ordered are listed, but only abnormal results are displayed) Labs Reviewed  CYTOLOGY, (ORAL, ANAL, URETHRAL) ANCILLARY ONLY    EKG   Radiology No results found.  Procedures Procedures (including critical care time)  Medications Ordered in UC Medications - No data to display  Initial Impression / Assessment and Plan / UC Course  I have reviewed the triage vital signs and the nursing notes.  Pertinent labs & imaging results that were available during my care of the patient were reviewed by me and considered in my medical  decision making (see chart for details).     Staff will notify him of any positives and treat per protocol. He declined blood testing Final Clinical Impressions(s) / UC Diagnoses   Final diagnoses:  Screening for STD (sexually transmitted disease)     Discharge Instructions      Staff will notify you of any positives on the swab     ED Prescriptions   None    PDMP not reviewed this encounter.   Zenia Resides, MD 05/17/22 1113    Zenia Resides, MD 05/17/22 1113

## 2022-05-17 NOTE — ED Triage Notes (Signed)
Patient requesting STI testing.  No sx's.

## 2022-05-20 LAB — CYTOLOGY, (ORAL, ANAL, URETHRAL) ANCILLARY ONLY
Chlamydia: NEGATIVE
Comment: NEGATIVE
Comment: NEGATIVE
Comment: NORMAL
Neisseria Gonorrhea: POSITIVE — AB
Trichomonas: POSITIVE — AB

## 2022-05-21 ENCOUNTER — Telehealth (HOSPITAL_COMMUNITY): Payer: Self-pay | Admitting: Emergency Medicine

## 2022-05-21 MED ORDER — METRONIDAZOLE 500 MG PO TABS: 2000.0000 mg | ORAL_TABLET | Freq: Once | ORAL | 0 refills | Status: AC

## 2022-05-21 NOTE — Telephone Encounter (Signed)
PEr protocol, patient will need treatment with IM ROcephin 500mg  for positive Gonorrhea.  WIll also need treatment with Flagyl.   Contacted patient by phone.  Verified identity using two identifiers.  Provided positive result.  Reviewed safe sex practices, notifying partners, and refraining from sexual activities for 7 days from time of treatment.  Patient verified understanding, all questions answered.   [facility] notified Verified pharmacy, prescription sent

## 2022-05-22 ENCOUNTER — Ambulatory Visit
Admission: EM | Admit: 2022-05-22 | Discharge: 2022-05-22 | Disposition: A | Discharge status: Home / Self Care | Payer: Managed Care, Other (non HMO) | Attending: Family Medicine | Admitting: Family Medicine

## 2022-05-22 DIAGNOSIS — A549 Gonococcal infection, unspecified: Secondary | ICD-10-CM

## 2022-05-22 MED ORDER — CEFTRIAXONE SODIUM 500 MG IJ SOLR
500.0000 mg | Freq: Once | INTRAMUSCULAR | Status: AC
  Administered 2022-05-22: 500 mg via INTRAMUSCULAR

## 2022-05-22 NOTE — ED Triage Notes (Signed)
Pt is present today for STD treat and is aware to pick up rx from pharmacy

## 2022-07-06 ENCOUNTER — Ambulatory Visit
Admission: EM | Admit: 2022-07-06 | Discharge: 2022-07-06 | Disposition: A | Discharge status: Home / Self Care | Payer: Managed Care, Other (non HMO) | Attending: Physician Assistant | Admitting: Physician Assistant

## 2022-07-06 DIAGNOSIS — Z113 Encounter for screening for infections with a predominantly sexual mode of transmission: Secondary | ICD-10-CM | POA: Diagnosis present

## 2022-07-06 NOTE — ED Provider Notes (Signed)
[name]    CSN: [id number] Arrival date & time: 07/06/22  4825      History   Chief Complaint Chief Complaint  Patient presents with   sti screening    HPI Ryan Esparza is a 21 y.o. male.   Patient presents today for routine STI testing.  He denies any specific exposure or concern.  He is sexually active with male partners.  He does not always use condoms.  He denies any symptoms including dysuria, urinary frequency, urinary urgency, abdominal pain, pelvic pain, penile discharge, testicular pain.  Denies any recent antibiotic use.  He declined HIV and syphilis testing.    History reviewed. No pertinent past medical history.  Patient Active Problem List   Diagnosis Date Noted   Substance use disorder 09/11/2017   Bereavement 09/11/2017   Acne 09/13/2013    History reviewed. No pertinent surgical history.     Home Medications    Prior to Admission medications   Not on File    Family History Family History  Problem Relation Age of Onset   Obesity Father     Social History Social History   Tobacco Use   Smoking status: Every Day   Smokeless tobacco: Never  Vaping Use   Vaping Use: Some days  Substance Use Topics   Alcohol use: No   Drug use: Never     Allergies   Patient has no known allergies.   Review of Systems Review of Systems  Constitutional:  Negative for activity change, appetite change, fatigue and fever.  Genitourinary:  Negative for dysuria, frequency, penile discharge, penile pain, penile swelling and urgency.     Physical Exam Triage Vital Signs ED Triage Vitals  Enc Vitals Group     BP 07/06/22 1010 (!) 86/55     Pulse Rate 07/06/22 1007 (!) 51     Resp 07/06/22 1007 18     Temp 07/06/22 1007 98 F (36.7 C)     Temp Source 07/06/22 1007 Oral     SpO2 07/06/22 1007 98 %     Weight --      Height --      Head Circumference --      Peak Flow --      Pain Score 07/06/22 1008 0     Pain Loc --       Pain Edu? --      Excl. in GC? --    No data found.  Updated Vital Signs BP 110/68   Pulse (!) 54   Temp 98 F (36.7 C) (Oral)   Resp 18   SpO2 98%   Visual Acuity Right Eye Distance:   Left Eye Distance:   Bilateral Distance:    Right Eye Near:   Left Eye Near:    Bilateral Near:     Physical Exam Vitals reviewed.  Constitutional:      General: He is awake.     Appearance: Normal appearance. He is well-developed. He is not ill-appearing.     Comments: Very pleasant male appears stated age in no acute distress sitting comfortably in exam room  HENT:     Head: Normocephalic and atraumatic.     Mouth/Throat:     Pharynx: No oropharyngeal exudate, posterior oropharyngeal erythema or uvula swelling.  Cardiovascular:     Rate and Rhythm: Normal rate and regular rhythm.     Heart sounds: Normal heart sounds, S1 normal and S2 normal. No murmur heard. Pulmonary:  Effort: Pulmonary effort is normal.     Breath sounds: Normal breath sounds. No stridor. No wheezing, rhonchi or rales.     Comments: Clear to auscultation bilaterally Abdominal:     Palpations: Abdomen is soft.     Tenderness: There is no abdominal tenderness.  Neurological:     Mental Status: He is alert.  Psychiatric:        Behavior: Behavior is cooperative.      UC Treatments / Results  Labs (all labs ordered are listed, but only abnormal results are displayed) Labs Reviewed  CYTOLOGY, (ORAL, ANAL, URETHRAL) ANCILLARY ONLY    EKG   Radiology No results found.  Procedures Procedures (including critical care time)  Medications Ordered in UC Medications - No data to display  Initial Impression / Assessment and Plan / UC Course  I have reviewed the triage vital signs and the nursing notes.  Pertinent labs & imaging results that were available during my care of the patient were reviewed by me and considered in my medical decision making (see chart for details).     STI swab collected  today via self swab.  Patient denies any symptoms we will defer treatment until results are available.  Discussed that he is to abstain from sex till results are available.  Discussed the importance of safe sex practices.  Offered STI information packet including condoms which he declined.  If he develops any symptoms he is to return for reevaluation.  Final Clinical Impressions(s) / UC Diagnoses   Final diagnoses:  Routine screening for STI (sexually transmitted infection)     Discharge Instructions      Monitor your MyChart for your results.  Please abstain from sex until you receive your results.  If you are positive you will need to be treated and we will contact you to arrange this.  You should use a condom with each sexual encounter.  If develop any symptoms please return for reevaluation.     ED Prescriptions   None    PDMP not reviewed this encounter.   Jeani Hawking, PA-C 07/06/22 1231

## 2022-07-06 NOTE — ED Triage Notes (Signed)
Pt here for sti screening, denies symptoms or exposure

## 2022-07-06 NOTE — Discharge Instructions (Signed)
Monitor your MyChart for your results.  Please abstain from sex until you receive your results.  If you are positive you will need to be treated and we will contact you to arrange this.  You should use a condom with each sexual encounter.  If develop any symptoms please return for reevaluation.

## 2022-07-09 LAB — CYTOLOGY, (ORAL, ANAL, URETHRAL) ANCILLARY ONLY
Chlamydia: NEGATIVE
Comment: NEGATIVE
Comment: NEGATIVE
Comment: NORMAL
Neisseria Gonorrhea: NEGATIVE
Trichomonas: NEGATIVE

## 2023-02-11 ENCOUNTER — Ambulatory Visit
Admission: EM | Admit: 2023-02-11 | Discharge: 2023-02-11 | Disposition: A | Discharge status: Home / Self Care | Payer: Managed Care, Other (non HMO) | Attending: Family Medicine | Admitting: Family Medicine

## 2023-02-11 DIAGNOSIS — Z202 Contact with and (suspected) exposure to infections with a predominantly sexual mode of transmission: Secondary | ICD-10-CM | POA: Diagnosis not present

## 2023-02-11 NOTE — ED Triage Notes (Signed)
Pt presents for STD testing after a partner disclosed she had an STD.

## 2023-02-11 NOTE — ED Provider Notes (Signed)
  [facility]   [id number] 02/11/23 Arrival Time: [id number]  ASSESSMENT & PLAN:  1. STD exposure    No empiric treatment.  Pending: Labs Reviewed  CYTOLOGY, (ORAL, ANAL, URETHRAL) ANCILLARY ONLY   Will notify of any positive results. Instructed to refrain from sexual activity for at least seven days.  Reviewed expectations re: course of current medical issues. Questions answered. Outlined signs and symptoms indicating need for more acute intervention. Patient verbalized understanding. After Visit Summary given.   SUBJECTIVE:  Ryan Esparza is a 22 y.o. male who reports that he was notified by male partner Afebrile. No abdominal or pelvic pain. No n/v. No rashes or lesions.   OBJECTIVE:  Vitals:   02/11/23 1015 02/11/23 1016  BP:  113/72  Pulse: 61   Resp: 18   Temp: 98.2 F (36.8 C)   TempSrc: Oral   SpO2: 97%      General appearance: alert, cooperative, appears stated age and no distress Throat: lips, mucosa, and tongue normal; teeth and gums normal Lungs: unlabored respirations; speaks full sentences without difficulty Back: no CVA tenderness; FROM at waist Abdomen: soft, non-tender GU: deferred Skin: warm and dry Psychological: alert and cooperative; normal mood and affect.    Labs Reviewed  CYTOLOGY, (ORAL, ANAL, URETHRAL) ANCILLARY ONLY    No Known Allergies  History reviewed. No pertinent past medical history. Family History  Problem Relation Age of Onset   Obesity Father    Social History   Socioeconomic History   Marital status: Single    Spouse name: Not on file   Number of children: Not on file   Years of education: Not on file   Highest education level: Not on file  Occupational History   Not on file  Tobacco Use   Smoking status: Every Day   Smokeless tobacco: Never  Vaping Use   Vaping Use: Some days  Substance and Sexual Activity   Alcohol use: No   Drug use: Never   Sexual activity: Yes    Birth control/protection:  None  Other Topics Concern   Not on file  Social History Narrative   Lives with mo and three siblings   Social Determinants of Health   Financial Resource Strain: Not on file  Food Insecurity: Not on file  Transportation Needs: Not on file  Physical Activity: Not on file  Stress: Not on file  Social Connections: Not on file  Intimate Partner Violence: Not on file           Vanessa Kick, MD 02/11/23 1034

## 2023-02-12 LAB — CYTOLOGY, (ORAL, ANAL, URETHRAL) ANCILLARY ONLY
Chlamydia: NEGATIVE
Comment: NEGATIVE
Comment: NEGATIVE
Comment: NORMAL
Neisseria Gonorrhea: NEGATIVE
Trichomonas: NEGATIVE

## 2023-05-14 ENCOUNTER — Ambulatory Visit
Admission: EM | Admit: 2023-05-14 | Discharge: 2023-05-14 | Disposition: A | Discharge status: Home / Self Care | Payer: Managed Care, Other (non HMO) | Attending: Internal Medicine | Admitting: Internal Medicine

## 2023-05-14 DIAGNOSIS — Z113 Encounter for screening for infections with a predominantly sexual mode of transmission: Secondary | ICD-10-CM | POA: Insufficient documentation

## 2023-05-14 NOTE — ED Provider Notes (Signed)
[name]    CSN: [id number] Arrival date & time: 05/14/23  4098      History   Chief Complaint No chief complaint on file.   HPI Ryan Esparza is a 22 y.o. male.   Patient presents today for routine STD testing.  He denies any symptoms or any exposure to STD.  Patient reports that he has had unprotected intercourse.  Patient does not want blood work for HIV or syphilis.     History reviewed. No pertinent past medical history.  Patient Active Problem List   Diagnosis Date Noted   Substance use disorder 09/11/2017   Bereavement 09/11/2017   Acne 09/13/2013    History reviewed. No pertinent surgical history.     Home Medications    Prior to Admission medications   Not on File    Family History Family History  Problem Relation Age of Onset   Obesity Father     Social History Social History   Tobacco Use   Smoking status: Every Day   Smokeless tobacco: Never  Vaping Use   Vaping Use: Some days  Substance Use Topics   Alcohol use: No   Drug use: Never     Allergies   Patient has no known allergies.   Review of Systems Review of Systems Per HPI  Physical Exam Triage Vital Signs ED Triage Vitals [05/14/23 0913]  Enc Vitals Group     BP 102/67     Pulse Rate (!) 59     Resp 16     Temp 97.8 F (36.6 C)     Temp Source Oral     SpO2 98 %     Weight      Height      Head Circumference      Peak Flow      Pain Score      Pain Loc      Pain Edu?      Excl. in GC?    No data found.  Updated Vital Signs BP 102/67 (BP Location: Left Arm)   Pulse (!) 59   Temp 97.8 F (36.6 C) (Oral)   Resp 16   SpO2 98%   Visual Acuity Right Eye Distance:   Left Eye Distance:   Bilateral Distance:    Right Eye Near:   Left Eye Near:    Bilateral Near:     Physical Exam Constitutional:      General: He is not in acute distress.    Appearance: Normal appearance. He is not toxic-appearing or diaphoretic.  HENT:     Head:  Normocephalic and atraumatic.  Eyes:     Extraocular Movements: Extraocular movements intact.     Conjunctiva/sclera: Conjunctivae normal.  Pulmonary:     Effort: Pulmonary effort is normal.  Genitourinary:    Comments: Deferred with shared decision making. Self swab performed.  Neurological:     General: No focal deficit present.     Mental Status: He is alert and oriented to person, place, and time. Mental status is at baseline.  Psychiatric:        Mood and Affect: Mood normal.        Behavior: Behavior normal.        Thought Content: Thought content normal.        Judgment: Judgment normal.      UC Treatments / Results  Labs (all labs ordered are listed, but only abnormal results are displayed) Labs Reviewed  CYTOLOGY, (ORAL, ANAL, URETHRAL) ANCILLARY  ONLY    EKG   Radiology No results found.  Procedures Procedures (including critical care time)  Medications Ordered in UC Medications - No data to display  Initial Impression / Assessment and Plan / UC Course  I have reviewed the triage vital signs and the nursing notes.  Pertinent labs & imaging results that were available during my care of the patient were reviewed by me and considered in my medical decision making (see chart for details).     Cytology swab pending for routine STD testing.  Given no exposure, will await results for treatment, if necessary.  Patient advised to follow-up with any further concerns.  Patient verbalized understanding and was agreeable with plan. Final Clinical Impressions(s) / UC Diagnoses   Final diagnoses:  Screening examination for venereal disease     Discharge Instructions      Your STD testing is pending.  Will call if anything is positive and treat if necessary.    ED Prescriptions   None    PDMP not reviewed this encounter.   Gustavus Bryant, [place] 05/14/23 [phone]

## 2023-05-14 NOTE — ED Triage Notes (Signed)
Pt c/o needing routine STD testing, pt would only like the swab done.

## 2023-05-14 NOTE — Discharge Instructions (Addendum)
Your STD testing is pending.  Will call if anything is positive and treat if necessary.

## 2023-05-15 LAB — CYTOLOGY, (ORAL, ANAL, URETHRAL) ANCILLARY ONLY
Chlamydia: NEGATIVE
Comment: NEGATIVE
Comment: NEGATIVE
Comment: NORMAL
Neisseria Gonorrhea: NEGATIVE
Trichomonas: NEGATIVE

## 2023-06-09 ENCOUNTER — Encounter: Payer: Self-pay | Admitting: Emergency Medicine

## 2023-06-09 ENCOUNTER — Other Ambulatory Visit: Payer: Self-pay

## 2023-06-09 ENCOUNTER — Ambulatory Visit
Admission: EM | Admit: 2023-06-09 | Discharge: 2023-06-09 | Disposition: A | Discharge status: Home / Self Care | Payer: Managed Care, Other (non HMO) | Attending: Physician Assistant | Admitting: Physician Assistant

## 2023-06-09 DIAGNOSIS — Z202 Contact with and (suspected) exposure to infections with a predominantly sexual mode of transmission: Secondary | ICD-10-CM | POA: Insufficient documentation

## 2023-06-09 NOTE — ED Triage Notes (Signed)
Pt here requesting STD testing; denies sx  

## 2023-06-09 NOTE — ED Provider Notes (Signed)
[name]    CSN: [id number] Arrival date & time: 06/09/23  1303      History   Chief Complaint Chief Complaint  Patient presents with   Exposure to STD    HPI Robertanthony Sullender is a 22 y.o. male.   Patient request STD testing.  Patient reports that he has not had any symptoms he just wants to be tested.  The history is provided by the patient. No language interpreter was used.  Exposure to STD    History reviewed. No pertinent past medical history.  Patient Active Problem List   Diagnosis Date Noted   Substance use disorder 09/11/2017   Bereavement 09/11/2017   Acne 09/13/2013    History reviewed. No pertinent surgical history.     Home Medications    Prior to Admission medications   Not on File    Family History Family History  Problem Relation Age of Onset   Obesity Father     Social History Social History   Tobacco Use   Smoking status: Every Day   Smokeless tobacco: Never  Vaping Use   Vaping Use: Some days  Substance Use Topics   Alcohol use: No   Drug use: Never     Allergies   Patient has no known allergies.   Review of Systems Review of Systems  All other systems reviewed and are negative.    Physical Exam Triage Vital Signs ED Triage Vitals  Enc Vitals Group     BP 06/09/23 1409 108/61     Pulse Rate 06/09/23 1409 (!) 58     Resp 06/09/23 1409 18     Temp 06/09/23 1409 97.8 F (36.6 C)     Temp Source 06/09/23 1409 Oral     SpO2 06/09/23 1409 98 %     Weight --      Height --      Head Circumference --      Peak Flow --      Pain Score 06/09/23 1410 0     Pain Loc --      Pain Edu? --      Excl. in GC? --    No data found.  Updated Vital Signs BP 108/61 (BP Location: Left Arm)   Pulse (!) 58   Temp 97.8 F (36.6 C) (Oral)   Resp 18   SpO2 98%   Visual Acuity Right Eye Distance:   Left Eye Distance:   Bilateral Distance:    Right Eye Near:   Left Eye Near:    Bilateral Near:      Physical Exam Vitals and nursing note reviewed.  Constitutional:      Appearance: He is well-developed.  HENT:     Head: Normocephalic.  Cardiovascular:     Rate and Rhythm: Normal rate.  Pulmonary:     Effort: Pulmonary effort is normal.  Abdominal:     General: There is no distension.  Musculoskeletal:        General: Normal range of motion.     Cervical back: Normal range of motion.  Skin:    General: Skin is warm.  Neurological:     General: No focal deficit present.     Mental Status: He is alert and oriented to person, place, and time.      UC Treatments / Results  Labs (all labs ordered are listed, but only abnormal results are displayed) Labs Reviewed  CYTOLOGY, (ORAL, ANAL, URETHRAL) ANCILLARY ONLY    EKG  Radiology No results found.  Procedures Procedures (including critical care time)  Medications Ordered in UC Medications - No data to display  Initial Impression / Assessment and Plan / UC Course  I have reviewed the triage vital signs and the nursing notes.  Pertinent labs & imaging results that were available during my care of the patient were reviewed by me and considered in my medical decision making (see chart for details).    /  Final Clinical Impressions(s) / UC Diagnoses   Final diagnoses:  Possible exposure to STD   Discharge Instructions   None    ED Prescriptions   None    PDMP not reviewed this encounter. An After Visit Summary was printed and given to the patient.       Elson Areas, [place] 06/09/23 1450

## 2023-06-10 LAB — CYTOLOGY, (ORAL, ANAL, URETHRAL) ANCILLARY ONLY
Chlamydia: NEGATIVE
Comment: NEGATIVE
Comment: NEGATIVE
Comment: NORMAL
Neisseria Gonorrhea: NEGATIVE
Trichomonas: NEGATIVE

## 2023-07-10 ENCOUNTER — Ambulatory Visit
Admission: EM | Admit: 2023-07-10 | Discharge: 2023-07-10 | Disposition: A | Discharge status: Home / Self Care | Payer: Managed Care, Other (non HMO) | Attending: Family Medicine | Admitting: Family Medicine

## 2023-07-10 ENCOUNTER — Encounter: Payer: Self-pay | Admitting: Emergency Medicine

## 2023-07-10 DIAGNOSIS — B353 Tinea pedis: Secondary | ICD-10-CM | POA: Diagnosis not present

## 2023-07-10 DIAGNOSIS — Z113 Encounter for screening for infections with a predominantly sexual mode of transmission: Secondary | ICD-10-CM | POA: Diagnosis present

## 2023-07-10 DIAGNOSIS — B356 Tinea cruris: Secondary | ICD-10-CM | POA: Diagnosis not present

## 2023-07-10 MED ORDER — FLUCONAZOLE 100 MG PO TABS: ORAL_TABLET | ORAL | 0 refills | Status: DC

## 2023-07-10 NOTE — ED Triage Notes (Signed)
Patient c/o rash in his inner thighs x 2 months.  Patient thought it was dry skin now he started using an OTC jock itch cream the past 2-3 days.  Can't really tell any improvement.  Also theres a rash on both feet unsure if it's related or not.

## 2023-07-10 NOTE — Discharge Instructions (Signed)
Fluconazole 100 mg--2 tablets by mouth the first day then 1 tablet by mouth daily for 6 more days.  You can continue to use the clotrimazole/lotrimin cream  Stay as dry as you can. Consider changing underwear and socks in the middle of your work day, and then getting dry as soon as you can after work.  Staff will notify you if anything is positive on the swab.

## 2023-07-10 NOTE — ED Provider Notes (Addendum)
[name]    CSN: [id number] Arrival date & time: 07/10/23  1133      History   Chief Complaint Chief Complaint  Patient presents with   Rash    HPI Ryan Esparza is a 22 y.o. male.    Rash Here for rash on his inner proximal thighs for a few weeks, maybe 2 months. Is a little itchy and irritated. Just started some topical lotrimin 2-3 days ago.   Also has some rash in between his toes.  Is working a lot outside and perspires a lot  He requests STD testing, no symptoms.   History reviewed. No pertinent past medical history.  Patient Active Problem List   Diagnosis Date Noted   Substance use disorder 09/11/2017   Bereavement 09/11/2017   Acne 09/13/2013    History reviewed. No pertinent surgical history.     Home Medications    Prior to Admission medications   Medication Sig Start Date End Date Taking? Authorizing Provider  fluconazole (DIFLUCAN) 100 MG tablet 2 tablets by mouth the first day, then 1 tablet daily for 6 more days. 07/10/23  Yes Jamiee Milholland, Janace Aris, MD    Family History Family History  Problem Relation Age of Onset   Obesity Father     Social History Social History   Tobacco Use   Smoking status: Every Day    Types: Cigars   Smokeless tobacco: Never  Vaping Use   Vaping status: Never Used  Substance Use Topics   Alcohol use: Yes   Drug use: Never     Allergies   Patient has no known allergies.   Review of Systems Review of Systems  Skin:  Positive for rash.     Physical Exam Triage Vital Signs ED Triage Vitals  Encounter Vitals Group     BP 07/10/23 1250 109/69     Systolic BP Percentile --      Diastolic BP Percentile --      Pulse Rate 07/10/23 1250 (!) 54     Resp 07/10/23 1250 16     Temp 07/10/23 1250 97.6 F (36.4 C)     Temp Source 07/10/23 1250 Oral     SpO2 07/10/23 1250 98 %     Weight 07/10/23 1252 130 lb (59 kg)     Height 07/10/23 1252 5\' 5"  (1.651 m)     Head Circumference --       Peak Flow --      Pain Score 07/10/23 1252 0     Pain Loc --      Pain Education --      Exclude from Growth Chart --    No data found.  Updated Vital Signs BP 109/69 (BP Location: Right Arm)   Pulse (!) 54   Temp 97.6 F (36.4 C) (Oral)   Resp 16   Ht 5\' 5"  (1.651 m)   Wt 59 kg   SpO2 98%   BMI 21.63 kg/m   Visual Acuity Right Eye Distance:   Left Eye Distance:   Bilateral Distance:    Right Eye Near:   Left Eye Near:    Bilateral Near:     Physical Exam Vitals reviewed.  Constitutional:      General: He is not in acute distress.    Appearance: He is not ill-appearing, toxic-appearing or diaphoretic.  Skin:    Coloration: Skin is not pale.     Comments: There is a confluent scaly hyperpigmented rash on his proximal medial  thighs, extending into perineum/groin.  Also there is a macerated rash in between his toes of both feet. No edema or sign of secondary bacterial infection  Neurological:     General: No focal deficit present.     Mental Status: He is alert and oriented to person, place, and time.  Psychiatric:        Behavior: Behavior normal.      UC Treatments / Results  Labs (all labs ordered are listed, but only abnormal results are displayed) Labs Reviewed  CYTOLOGY, (ORAL, ANAL, URETHRAL) ANCILLARY ONLY    EKG   Radiology No results found.  Procedures Procedures (including critical care time)  Medications Ordered in UC Medications - No data to display  Initial Impression / Assessment and Plan / UC Course  I have reviewed the triage vital signs and the nursing notes.  Pertinent labs & imaging results that were available during my care of the patient were reviewed by me and considered in my medical decision making (see chart for details).        The rash is extensive, and since on feet and proximal legs, fluconazole for 7 days sent to the pharmacy. He can continue the lotrimin topical.  Urethral self swab done, and we will notify  him and tx per protocol any positives. He declined blood work for HIV and RPR today.  Final Clinical Impressions(s) / UC Diagnoses   Final diagnoses:  Tinea cruris  Tinea pedis of both feet     Discharge Instructions      Fluconazole 100 mg--2 tablets by mouth the first day then 1 tablet by mouth daily for 6 more days.  You can continue to use the clotrimazole/lotrimin cream  Stay as dry as you can. Consider changing underwear and socks in the middle of your work day, and then getting dry as soon as you can after work.  Staff will notify you if anything is positive on the swab.     ED Prescriptions     Medication Sig Dispense Auth. Provider   fluconazole (DIFLUCAN) 100 MG tablet 2 tablets by mouth the first day, then 1 tablet daily for 6 more days. 8 tablet Raziya Aveni, Janace Aris, MD      PDMP not reviewed this encounter.   Zenia Resides, MD 07/10/23 1313    Zenia Resides, MD 07/10/23 1314

## 2023-07-13 LAB — CYTOLOGY, (ORAL, ANAL, URETHRAL) ANCILLARY ONLY
Chlamydia: NEGATIVE
Comment: NEGATIVE
Comment: NEGATIVE
Comment: NORMAL
Neisseria Gonorrhea: NEGATIVE
Trichomonas: NEGATIVE

## 2024-05-03 ENCOUNTER — Encounter: Payer: Self-pay | Admitting: Emergency Medicine

## 2024-05-03 ENCOUNTER — Ambulatory Visit
Admission: EM | Admit: 2024-05-03 | Discharge: 2024-05-03 | Disposition: A | Discharge status: Home / Self Care | Attending: Family Medicine | Admitting: Family Medicine

## 2024-05-03 ENCOUNTER — Other Ambulatory Visit: Payer: Self-pay

## 2024-05-03 DIAGNOSIS — Z113 Encounter for screening for infections with a predominantly sexual mode of transmission: Secondary | ICD-10-CM | POA: Insufficient documentation

## 2024-05-03 NOTE — Discharge Instructions (Signed)
 You were seen today for STD screening.  Your swab will be resulted tomorrow.  You will see these results on mychart and you will be notified if anything is positive for treatment.  Do not have sex until results are completed.

## 2024-05-03 NOTE — ED Provider Notes (Signed)
 [name]    CSN: [id number] Arrival date & time: 05/03/24  0934      History   Chief Complaint Chief Complaint  Patient presents with   Exposure to STD    HPI Kirk Zajdel is a 23 y.o. male.    Exposure to STD  He is here for STD screening.  He is without symptoms.  He denies any known exposures.  He declines blood work today.       History reviewed. No pertinent past medical history.  Patient Active Problem List   Diagnosis Date Noted   Substance use disorder 09/11/2017   Bereavement 09/11/2017   Acne 09/13/2013    History reviewed. No pertinent surgical history.     Home Medications    Prior to Admission medications   Medication Sig Start Date End Date Taking? Authorizing Provider  fluconazole  (DIFLUCAN ) 100 MG tablet 2 tablets by mouth the first day, then 1 tablet daily for 6 more days. Patient not taking: Reported on 05/03/2024 07/10/23   Ann Keto, MD    Family History Family History  Problem Relation Age of Onset   Obesity Father     Social History Social History   Tobacco Use   Smoking status: Every Day    Types: Cigars   Smokeless tobacco: Never  Vaping Use   Vaping status: Never Used  Substance Use Topics   Alcohol use: Yes   Drug use: Yes    Types: Marijuana     Allergies   Patient has no known allergies.   Review of Systems Review of Systems  Constitutional: Negative.   HENT: Negative.    Respiratory: Negative.    Gastrointestinal: Negative.   Musculoskeletal: Negative.   Psychiatric/Behavioral: Negative.       Physical Exam Triage Vital Signs ED Triage Vitals  Encounter Vitals Group     BP 05/03/24 0952 110/69     Systolic BP Percentile --      Diastolic BP Percentile --      Pulse Rate 05/03/24 0952 62     Resp 05/03/24 0952 18     Temp 05/03/24 0952 97.8 F (36.6 C)     Temp Source 05/03/24 0952 Oral     SpO2 05/03/24 0952 96 %     Weight --      Height --      Head  Circumference --      Peak Flow --      Pain Score 05/03/24 0953 0     Pain Loc --      Pain Education --      Exclude from Growth Chart --    No data found.  Updated Vital Signs BP 110/69 (BP Location: Right Arm)   Pulse 62   Temp 97.8 F (36.6 C) (Oral)   Resp 18   SpO2 96%   Visual Acuity Right Eye Distance:   Left Eye Distance:   Bilateral Distance:    Right Eye Near:   Left Eye Near:    Bilateral Near:     Physical Exam Constitutional:      Appearance: Normal appearance. He is normal weight.  Cardiovascular:     Rate and Rhythm: Normal rate and regular rhythm.  Pulmonary:     Effort: Pulmonary effort is normal.     Breath sounds: Normal breath sounds.  Neurological:     General: No focal deficit present.     Mental Status: He is alert.  Psychiatric:  Mood and Affect: Mood normal.      UC Treatments / Results  Labs (all labs ordered are listed, but only abnormal results are displayed) Labs Reviewed  CYTOLOGY, (ORAL, ANAL, URETHRAL) ANCILLARY ONLY    EKG   Radiology No results found.  Procedures Procedures (including critical care time)  Medications Ordered in UC Medications - No data to display  Initial Impression / Assessment and Plan / UC Course  I have reviewed the triage vital signs and the nursing notes.  Pertinent labs & imaging results that were available during my care of the patient were reviewed by me and considered in my medical decision making (see chart for details).   Final Clinical Impressions(s) / UC Diagnoses   Final diagnoses:  Screening for STD (sexually transmitted disease)     Discharge Instructions      You were seen today for STD screening.  Your swab will be resulted tomorrow.  You will see these results on mychart and you will be notified if anything is positive for treatment.  Do not have sex until results are completed.   ED Prescriptions   None    PDMP not reviewed this encounter.   Lesle Ras, MD 05/03/24 1001

## 2024-05-03 NOTE — ED Triage Notes (Signed)
Pt here for STD testing; denies sx  

## 2024-05-05 LAB — CYTOLOGY, (ORAL, ANAL, URETHRAL) ANCILLARY ONLY
Chlamydia: NEGATIVE
Comment: NEGATIVE
Comment: NEGATIVE
Comment: NORMAL
Neisseria Gonorrhea: NEGATIVE
Trichomonas: POSITIVE — AB

## 2024-05-06 ENCOUNTER — Ambulatory Visit (HOSPITAL_COMMUNITY): Payer: Self-pay

## 2024-05-06 MED ORDER — METRONIDAZOLE 500 MG PO TABS: 2000.0000 mg | ORAL_TABLET | Freq: Once | ORAL | 0 refills | Status: AC

## 2024-06-01 ENCOUNTER — Ambulatory Visit
Admission: EM | Admit: 2024-06-01 | Discharge: 2024-06-01 | Disposition: A | Discharge status: Home / Self Care | Attending: Family Medicine | Admitting: Family Medicine

## 2024-06-01 DIAGNOSIS — B353 Tinea pedis: Secondary | ICD-10-CM

## 2024-06-01 MED ORDER — KETOCONAZOLE 2 % EX CREA: 1.0000 | TOPICAL_CREAM | Freq: Every day | CUTANEOUS | 0 refills | Status: DC

## 2024-06-01 MED ORDER — FLUCONAZOLE 150 MG PO TABS: 150.0000 mg | ORAL_TABLET | ORAL | 0 refills | Status: DC

## 2024-06-01 NOTE — ED Triage Notes (Signed)
 Pt reports he has cracking dry areas on both feet 2 weeks

## 2024-06-01 NOTE — Discharge Instructions (Addendum)
 You may also use over the counter Domeboro medicated soaks as directed on the packaging.

## 2024-06-01 NOTE — ED Provider Notes (Signed)
  [facility]   [id number] 06/01/24 Arrival Time: 1029  ASSESSMENT & PLAN:  1. Tinea pedis of both feet    Begin: Meds ordered this encounter  Medications   fluconazole  (DIFLUCAN ) 150 MG tablet    Sig: Take 1 tablet (150 mg total) by mouth once a week.    Dispense:  6 tablet    Refill:  0   ketoconazole (NIZORAL) 2 % cream    Sig: Apply 1 Application topically daily. For two weeks.    Dispense:  60 g    Refill:  0     Discharge Instructions      You may also use over the counter Domeboro medicated soaks as directed on the packaging.  Merino wool socks. Keep feet clean and dry.  Will follow up with PCP or here if worsening or failing to improve as anticipated. Reviewed expectations re: course of current medical issues. Questions answered. Outlined signs and symptoms indicating need for more acute intervention. Patient verbalized understanding. After Visit Summary given.   SUBJECTIVE:  Ryan Esparza is a 23 y.o. male who presents with a skin complaint. Questions athletes foot; long-standing; both feet; painful and weepy at times. No tx PTA.  OBJECTIVE: Vitals:   06/01/24 1048  BP: 119/78  Pulse: (!) 57  Resp: 20  Temp: 98 F (36.7 C)  TempSrc: Oral  SpO2: 98%    General appearance: alert; no distress Extremities: no edema; moves all extremities normally Skin: warm and dry;  erythematous, macerated, weepy whitish eruption involving the areas between toes on both feet Psychological: alert and cooperative; normal mood and affect  No Known Allergies  History reviewed. No pertinent past medical history. Social History   Socioeconomic History   Marital status: Single    Spouse name: Not on file   Number of children: Not on file   Years of education: Not on file   Highest education level: Not on file  Occupational History   Not on file  Tobacco Use   Smoking status: Every Day    Types: Cigars   Smokeless tobacco: Never  Vaping Use   Vaping  status: Never Used  Substance and Sexual Activity   Alcohol use: Yes   Drug use: Yes    Types: Marijuana   Sexual activity: Yes    Birth control/protection: None  Other Topics Concern   Not on file  Social History Narrative   Lives with mo and three siblings   Social Drivers of Corporate investment banker Strain: Not on file  Food Insecurity: Not on file  Transportation Needs: Not on file  Physical Activity: Not on file  Stress: Not on file  Social Connections: Not on file  Intimate Partner Violence: Not on file   Family History  Problem Relation Age of Onset   Obesity Father    History reviewed. No pertinent surgical history.    Afton Albright, MD 06/01/24 1229

## 2024-06-23 ENCOUNTER — Telehealth: Payer: Self-pay

## 2024-06-23 NOTE — Telephone Encounter (Signed)
 Pt called clinic in regards to a letter he received in the mail about test results.  I explained to patient that he tested positive for trichomoniasis and that we called in a prescription for him and that he needed to take the medication and refrain from sexual contact for 7 days after treatment. Pt verbalized understanding.

## 2024-07-02 ENCOUNTER — Telehealth: Payer: Self-pay | Admitting: *Deleted

## 2024-07-02 MED ORDER — METRONIDAZOLE 500 MG PO TABS: 2000.0000 mg | ORAL_TABLET | Freq: Once | ORAL | 0 refills | Status: AC

## 2024-07-02 NOTE — Telephone Encounter (Signed)
 Pt presented to registration stating that the pharmacy does not have the prescription for metronidazole  that was sent in on 05/06/2024. Discussed with Harlene Sink, PA-C. [facility] to send in prescription for metronidazole  2000 mg PO once. Pt requests Rx be sent to [facility] on [address].

## 2024-08-05 ENCOUNTER — Other Ambulatory Visit: Payer: Self-pay

## 2024-08-05 ENCOUNTER — Ambulatory Visit
Admission: EM | Admit: 2024-08-05 | Discharge: 2024-08-05 | Disposition: A | Discharge status: Home / Self Care | Attending: Nurse Practitioner

## 2024-08-05 VITALS — BP 108/70 | HR 53 | Temp 98.5°F | Resp 16

## 2024-08-05 DIAGNOSIS — Z8619 Personal history of other infectious and parasitic diseases: Secondary | ICD-10-CM | POA: Insufficient documentation

## 2024-08-05 DIAGNOSIS — Z113 Encounter for screening for infections with a predominantly sexual mode of transmission: Secondary | ICD-10-CM | POA: Insufficient documentation

## 2024-08-05 NOTE — ED Triage Notes (Signed)
 States he was treated for Trichomonas in May and wants to make sure it has cleared up. He did not have symptoms when he tested positive. States he did complete treatment. Denies symptoms today.

## 2024-08-05 NOTE — ED Provider Notes (Signed)
 [name]    CSN: [id number] Arrival date & time: 08/05/24  1154      History   Chief Complaint Chief Complaint  Patient presents with   Labs Only    HPI Ryan Esparza is a 23 y.o. male.   Discussed the use of AI scribe software for clinical note transcription with the patient, who gave verbal consent to proceed.   The patient presents for follow-up after testing positive for trichomonas on May 13th during a routine STI checkup. He has completed the prescribed medication course and is now seeking confirmation that the infection has cleared. The patient denies any current symptoms such as pain during urination, penile discharge, or sores. He is specifically interested in a swab test for chlamydia and trichomonas but declines blood work. The patient reports no other health concerns or complaints at this time.  The following portions of the patient's history were reviewed and updated as appropriate: allergies, current medications, past family history, past medical history, past social history, past surgical history, and problem list.    History reviewed. No pertinent past medical history.  Patient Active Problem List   Diagnosis Date Noted   Substance use disorder 09/11/2017   Bereavement 09/11/2017   Acne 09/13/2013    History reviewed. No pertinent surgical history.     Home Medications    Prior to Admission medications   Not on File    Family History Family History  Problem Relation Age of Onset   Obesity Father     Social History Social History   Tobacco Use   Smoking status: Former    Types: Cigars   Smokeless tobacco: Never  Vaping Use   Vaping status: Every Day  Substance Use Topics   Alcohol use: Yes    Comment: occasional   Drug use: Not Currently    Types: Marijuana     Allergies   Patient has no known allergies.   Review of Systems Review of Systems  Genitourinary:  Negative for dysuria, genital sores, penile discharge  and penile pain.  All other systems reviewed and are negative.    Physical Exam Triage Vital Signs ED Triage Vitals  Encounter Vitals Group     BP 08/05/24 1211 108/70     Girls Systolic BP Percentile --      Girls Diastolic BP Percentile --      Boys Systolic BP Percentile --      Boys Diastolic BP Percentile --      Pulse Rate 08/05/24 1211 (!) 53     Resp 08/05/24 1211 16     Temp 08/05/24 1211 98.5 F (36.9 C)     Temp Source 08/05/24 1211 Oral     SpO2 08/05/24 1211 98 %     Weight --      Height --      Head Circumference --      Peak Flow --      Pain Score 08/05/24 1209 0     Pain Loc --      Pain Education --      Exclude from Growth Chart --    No data found.  Updated Vital Signs BP 108/70 (BP Location: Left Arm)   Pulse (!) 53   Temp 98.5 F (36.9 C) (Oral)   Resp 16   SpO2 98%   Visual Acuity Right Eye Distance:   Left Eye Distance:   Bilateral Distance:    Right Eye Near:   Left Eye Near:  Bilateral Near:     Physical Exam Constitutional:      General: He is not in acute distress.    Appearance: Normal appearance. He is not ill-appearing, toxic-appearing or diaphoretic.  HENT:     Head: Normocephalic.     Nose: Nose normal.     Mouth/Throat:     Mouth: Mucous membranes are moist.  Eyes:     Conjunctiva/sclera: Conjunctivae normal.  Cardiovascular:     Rate and Rhythm: Normal rate.  Pulmonary:     Effort: Pulmonary effort is normal.  Abdominal:     Palpations: Abdomen is soft.  Genitourinary:    Comments: Deferred; patient performed self-swab for Aptima testing  Musculoskeletal:        General: Normal range of motion.     Cervical back: Normal range of motion and neck supple.  Skin:    General: Skin is warm and dry.  Neurological:     General: No focal deficit present.     Mental Status: He is alert and oriented to person, place, and time.  Psychiatric:        Mood and Affect: Mood normal.        Behavior: Behavior normal.       UC Treatments / Results  Labs (all labs ordered are listed, but only abnormal results are displayed) Labs Reviewed  CYTOLOGY, (ORAL, ANAL, URETHRAL) ANCILLARY ONLY    EKG   Radiology No results found.  Procedures Procedures (including critical care time)  Medications Ordered in UC Medications - No data to display  Initial Impression / Assessment and Plan / UC Course  I have reviewed the triage vital signs and the nursing notes.  Pertinent labs & imaging results that were available during my care of the patient were reviewed by me and considered in my medical decision making (see chart for details).    Patient presents for routine STI screening. Tests obtained today include gonorrhea, chlamydia, trichomonas. Patient declined blood work for HIV, syphilis, and hepatitis C. Results are pending. Patient was counseled to abstain from sexual activity until all results have been received, any necessary treatment has been completed, and any symptoms, if present, have resolved. Safe sex practices were discussed and encouraged, including consistent condom use and regular screening with new partners. Patient advised they will only be contacted if any results are positive or require follow-up; otherwise, they may review their results through MyChart.  Today's evaluation has revealed no signs of a dangerous process. Discussed diagnosis with patient and/or guardian. Patient and/or guardian aware of their diagnosis, possible red flag symptoms to watch out for and need for close follow up. Patient and/or guardian understands verbal and written discharge instructions. Patient and/or guardian comfortable with plan and disposition.  Patient and/or guardian has a clear mental status at this time, good insight into illness (after discussion and teaching) and has clear judgment to make decisions regarding their care  Documentation was completed with the aid of voice recognition software.  Transcription may contain typographical errors. Final Clinical Impressions(s) / UC Diagnoses   Final diagnoses:  Screening examination for STD (sexually transmitted disease)  History of trichomoniasis     Discharge Instructions      Testing for gonorrhea, chlamydia and trichomonas is pending. You should not have any sexual activity until you receive the results of the tests. You will only be notified for positive results. You may go online to MyChart and review your results. Practice safe sex practices by wearing a condom every time  you have sex. Remember that people who have STIs may not experience any symptoms. However, even without symptoms, these infections can be spread from person to person and require treatment. STIs can be treated, and many STIs can be cured. However, some STIs cannot be cured and will affect you for the rest of your life. It's important to be checked regularly for STIs. You should also consider taking pre-exposure prophylaxis (PrEP) to prevent HIV infection.     ED Prescriptions   None    PDMP not reviewed this encounter.   Iola Lukes, [place] 08/05/24 1332

## 2024-08-05 NOTE — Discharge Instructions (Addendum)
 Testing for gonorrhea, chlamydia and trichomonas is pending. You should not have any sexual activity until you receive the results of the tests. You will only be notified for positive results. You may go online to MyChart and review your results. Practice safe sex practices by wearing a condom every time you have sex. Remember that people who have STIs may not experience any symptoms. However, even without symptoms, these infections can be spread from person to person and require treatment. STIs can be treated, and many STIs can be cured. However, some STIs cannot be cured and will affect you for the rest of your life. It's important to be checked regularly for STIs. You should also consider taking pre-exposure prophylaxis (PrEP) to prevent HIV infection.

## 2024-08-08 ENCOUNTER — Ambulatory Visit (HOSPITAL_COMMUNITY): Payer: Self-pay

## 2024-08-08 LAB — CYTOLOGY, (ORAL, ANAL, URETHRAL) ANCILLARY ONLY
Chlamydia: NEGATIVE
Comment: NEGATIVE
Comment: NEGATIVE
Comment: NORMAL
Neisseria Gonorrhea: NEGATIVE
Trichomonas: POSITIVE — AB

## 2024-08-08 MED ORDER — METRONIDAZOLE 500 MG PO TABS
2000.0000 mg | ORAL_TABLET | Freq: Once | ORAL | 0 refills | Status: AC
Start: 2024-08-08 — End: 2024-08-08

## 2024-10-24 ENCOUNTER — Ambulatory Visit: Admission: EM | Admit: 2024-10-24 | Discharge: 2024-10-24 | Disposition: A | Discharge status: Home / Self Care

## 2024-10-24 ENCOUNTER — Encounter: Payer: Self-pay | Admitting: Emergency Medicine

## 2024-10-24 DIAGNOSIS — Z113 Encounter for screening for infections with a predominantly sexual mode of transmission: Secondary | ICD-10-CM | POA: Insufficient documentation

## 2024-10-24 NOTE — Discharge Instructions (Signed)
  1. Screening for STD (sexually transmitted disease) (Primary) - Cytology swab - Urethral; GC / Chlamydia, Trichomonas collected in UC and sent to lab for further testing results should be available in 2 to 3 days if any abnormality is noted patient will be contacted appropriate treatment provided. - Continue to monitor for any other medical concerns if there is any new symptoms follow-up in ER for further evaluation management.

## 2024-10-24 NOTE — ED Provider Notes (Signed)
  [facility]  Note:  This document was prepared using Conservation officer, historic buildings and may include unintentional dictation errors.  MRN: [id number] DOB: 01-Oct-2001  Subjective:   Ryan Esparza is a 23 y.o. male presenting for STD screening.  Patient reports he is asymptomatic no dysuria, no penile discharge, no penile lesion, no abdominal pain, no flank pain, no increased urinary frequency.  Patient does not want blood testing performed today.  No current facility-administered medications for this encounter. No current outpatient medications on file.   No Known Allergies  History reviewed. No pertinent past medical history.   History reviewed. No pertinent surgical history.  Family History  Problem Relation Age of Onset   Obesity Father     Social History   Tobacco Use   Smoking status: Former    Types: Cigars   Smokeless tobacco: Never  Vaping Use   Vaping status: Every Day  Substance Use Topics   Alcohol use: Yes    Comment: occasional   Drug use: Not Currently    Types: Marijuana    ROS Refer to HPI for ROS details.  Objective:   Vitals: BP 119/74   Pulse 67   Temp 97.9 F (36.6 C) (Oral)   Resp 16   SpO2 97%   Physical Exam Vitals and nursing note reviewed.  Constitutional:      General: He is not in acute distress.    Appearance: Normal appearance. He is well-developed. He is not ill-appearing or toxic-appearing.  HENT:     Head: Normocephalic.  Cardiovascular:     Rate and Rhythm: Normal rate.  Pulmonary:     Effort: Pulmonary effort is normal. No respiratory distress.     Breath sounds: No stridor. No wheezing.  Abdominal:     Palpations: Abdomen is soft.     Tenderness: There is no abdominal tenderness. There is no right CVA tenderness or left CVA tenderness.  Skin:    General: Skin is warm and dry.  Neurological:     General: No focal deficit present.     Mental Status: He is alert and oriented to person, place, and time.   Psychiatric:        Mood and Affect: Mood normal.        Behavior: Behavior normal.     Procedures  No results found for this or any previous visit (from the past 24 hours).  No results found.   Assessment and Plan :     Discharge Instructions        1. Screening for STD (sexually transmitted disease) (Primary) - Cytology swab - Urethral; GC / Chlamydia, Trichomonas collected in UC and sent to lab for further testing results should be available in 2 to 3 days if any abnormality is noted patient will be contacted appropriate treatment provided. - Continue to monitor for any other medical concerns if there is any new symptoms follow-up in ER for further evaluation management.        Nikesha Kwasny B Sulayman Manning   Kennen Stammer, [place], [place] 10/24/24 1104

## 2024-10-24 NOTE — ED Triage Notes (Signed)
 Pt here for STD testing. Asymptomatic. No known exposures. Does not want blood work.

## 2024-10-25 LAB — CYTOLOGY, (ORAL, ANAL, URETHRAL) ANCILLARY ONLY
Chlamydia: NEGATIVE
Comment: NEGATIVE
Comment: NEGATIVE
Comment: NORMAL
Neisseria Gonorrhea: NEGATIVE
Trichomonas: NEGATIVE

## 2024-11-20 ENCOUNTER — Ambulatory Visit
Admission: EM | Admit: 2024-11-20 | Discharge: 2024-11-20 | Disposition: A | Discharge status: Home / Self Care | Attending: Student | Admitting: Student

## 2024-11-20 DIAGNOSIS — Z113 Encounter for screening for infections with a predominantly sexual mode of transmission: Secondary | ICD-10-CM | POA: Diagnosis present

## 2024-11-20 NOTE — ED Provider Notes (Signed)
 [facility]    CSN: [id number] Arrival date & time: 11/20/24  1440      History   Chief Complaint Chief Complaint  Patient presents with   SEXUALLY TRANSMITTED DISEASE    HPI Ryan Esparza is a 23 y.o. male presenting for STI screen - asymptomatic.  Denies penile discharge, penile rash or lesion, testicular rash or lesion, penile or testicular pain, abdominal pain, CVAT, fevers.   HPI  History reviewed. No pertinent past medical history.  Patient Active Problem List   Diagnosis Date Noted   Substance use disorder 09/11/2017   Bereavement 09/11/2017   Acne 09/13/2013    History reviewed. No pertinent surgical history.     Home Medications    Prior to Admission medications   Not on File    Family History Family History  Problem Relation Age of Onset   Obesity Father     Social History Social History   Tobacco Use   Smoking status: Former    Types: Cigars   Smokeless tobacco: Never  Vaping Use   Vaping status: Former  Substance Use Topics   Alcohol use: Yes    Comment: occasional   Drug use: Not Currently    Types: Marijuana     Allergies   Patient has no known allergies.   Review of Systems Review of Systems  Constitutional:  Negative for chills and fever.  HENT:  Negative for sore throat.   Eyes:  Negative for pain and redness.  Respiratory:  Negative for shortness of breath.   Cardiovascular:  Negative for chest pain.  Gastrointestinal:  Negative for abdominal pain, diarrhea, nausea and vomiting.  Genitourinary:  Negative for decreased urine volume, difficulty urinating, dysuria, flank pain, frequency, genital sores, hematuria and urgency.  Musculoskeletal:  Negative for back pain.  Skin:  Negative for rash.     Physical Exam Triage Vital Signs ED Triage Vitals  Encounter Vitals Group     BP 11/20/24 1452 113/62     Girls Systolic BP Percentile --      Girls Diastolic BP Percentile --      Boys Systolic BP Percentile  --      Boys Diastolic BP Percentile --      Pulse Rate 11/20/24 1452 61     Resp 11/20/24 1452 18     Temp 11/20/24 1452 98.1 F (36.7 C)     Temp Source 11/20/24 1452 Oral     SpO2 11/20/24 1452 99 %     Weight --      Height --      Head Circumference --      Peak Flow --      Pain Score 11/20/24 1451 0     Pain Loc --      Pain Education --      Exclude from Growth Chart --    No data found.  Updated Vital Signs BP 113/62 (BP Location: Left Arm)   Pulse 61   Temp 98.1 F (36.7 C) (Oral)   Resp 18   SpO2 99%   Visual Acuity Right Eye Distance:   Left Eye Distance:   Bilateral Distance:    Right Eye Near:   Left Eye Near:    Bilateral Near:     Physical Exam Vitals reviewed.  Constitutional:      General: He is not in acute distress.    Appearance: Normal appearance. He is not ill-appearing or diaphoretic.  HENT:     Head: Normocephalic  and atraumatic.  Cardiovascular:     Rate and Rhythm: Normal rate and regular rhythm.     Heart sounds: Normal heart sounds.  Pulmonary:     Effort: Pulmonary effort is normal.     Breath sounds: Normal breath sounds.  Skin:    General: Skin is warm.  Neurological:     General: No focal deficit present.     Mental Status: He is alert and oriented to person, place, and time.  Psychiatric:        Mood and Affect: Mood normal.        Behavior: Behavior normal.        Thought Content: Thought content normal.        Judgment: Judgment normal.      UC Treatments / Results  Labs (all labs ordered are listed, but only abnormal results are displayed) Labs Reviewed  CYTOLOGY, (ORAL, ANAL, URETHRAL) ANCILLARY ONLY    EKG   Radiology No results found.  Procedures Procedures (including critical care time)  Medications Ordered in UC Medications - No data to display  Initial Impression / Assessment and Plan / UC Course  I have reviewed the triage vital signs and the nursing notes.  Pertinent labs & imaging  results that were available during my care of the patient were reviewed by me and considered in my medical decision making (see chart for details).     Patient is a pleasant 23 year old male presenting for STI screening- asymptomatic. The patient is afebrile and nontachycardic.  Antipyretic has not been administered today.  Self swab collected for gonorrhea, chlamydia, trichomonas. Declines HIV, syphilis screening. Will call with any positive lab results. safe sex precautions.  Final Clinical Impressions(s) / UC Diagnoses   Final diagnoses:  Routine screening for STI (sexually transmitted infection)   Discharge Instructions   None    ED Prescriptions   None    PDMP not reviewed this encounter.   Arlyss Leita BRAVO, PA-C 11/20/24 [phone]

## 2024-11-20 NOTE — ED Triage Notes (Signed)
 Pt being seen for STD testing. Pt reports no known exposure. Pt does not want blood work.

## 2024-11-21 LAB — CYTOLOGY, (ORAL, ANAL, URETHRAL) ANCILLARY ONLY
Chlamydia: NEGATIVE
Comment: NEGATIVE
Comment: NEGATIVE
Comment: NORMAL
Neisseria Gonorrhea: NEGATIVE
Trichomonas: NEGATIVE

## 2024-12-04 ENCOUNTER — Ambulatory Visit: Admission: EM | Admit: 2024-12-04 | Discharge: 2024-12-04 | Disposition: A | Discharge status: Home / Self Care

## 2024-12-04 ENCOUNTER — Encounter: Payer: Self-pay | Admitting: Emergency Medicine

## 2024-12-04 DIAGNOSIS — Z202 Contact with and (suspected) exposure to infections with a predominantly sexual mode of transmission: Secondary | ICD-10-CM

## 2024-12-04 MED ORDER — METRONIDAZOLE 500 MG PO TABS: 500.0000 mg | ORAL_TABLET | Freq: Two times a day (BID) | ORAL | 0 refills | Status: AC

## 2024-12-04 NOTE — ED Triage Notes (Signed)
 Pt reports possible exposure to Trich. Denies any current symptoms.

## 2024-12-04 NOTE — ED Provider Notes (Signed)
 [name]    CSN: [id number] Arrival date & time: 12/04/24  0914      History   Chief Complaint Chief Complaint  Patient presents with   Exposure to STD    HPI Ryan Esparza is a 23 y.o. male.   Pt presents today due contact with trichomonas. Pt states that he was told by his partner 2 days ago, denies symptoms.   The history is provided by the patient.  Exposure to STD    History reviewed. No pertinent past medical history.  Patient Active Problem List   Diagnosis Date Noted   Substance use disorder 09/11/2017   Bereavement 09/11/2017   Acne 09/13/2013    History reviewed. No pertinent surgical history.     Home Medications    Prior to Admission medications  Medication Sig Start Date End Date Taking? Authorizing Provider  metroNIDAZOLE  (FLAGYL ) 500 MG tablet Take 1 tablet (500 mg total) by mouth 2 (two) times daily. 12/04/24  Yes Andra Corean BROCKS, PA-C    Family History Family History  Problem Relation Age of Onset   Obesity Father     Social History Social History[1]   Allergies   Patient has no known allergies.   Review of Systems Review of Systems   Physical Exam Triage Vital Signs ED Triage Vitals  Encounter Vitals Group     BP 12/04/24 0939 117/73     Girls Systolic BP Percentile --      Girls Diastolic BP Percentile --      Boys Systolic BP Percentile --      Boys Diastolic BP Percentile --      Pulse Rate 12/04/24 0939 64     Resp 12/04/24 0939 17     Temp 12/04/24 0939 98.1 F (36.7 C)     Temp Source 12/04/24 0939 Oral     SpO2 12/04/24 0939 98 %     Weight --      Height --      Head Circumference --      Peak Flow --      Pain Score 12/04/24 0937 0     Pain Loc --      Pain Education --      Exclude from Growth Chart --    No data found.  Updated Vital Signs BP 117/73 (BP Location: Left Arm)   Pulse 64   Temp 98.1 F (36.7 C) (Oral)   Resp 17   SpO2 98%   Visual Acuity Right Eye  Distance:   Left Eye Distance:   Bilateral Distance:    Right Eye Near:   Left Eye Near:    Bilateral Near:     Physical Exam Vitals and nursing note reviewed.  Constitutional:      General: He is not in acute distress.    Appearance: Normal appearance. He is not ill-appearing, toxic-appearing or diaphoretic.  Eyes:     General: No scleral icterus. Cardiovascular:     Rate and Rhythm: Normal rate and regular rhythm.     Heart sounds: Normal heart sounds.  Pulmonary:     Effort: Pulmonary effort is normal. No respiratory distress.     Breath sounds: Normal breath sounds. No wheezing or rhonchi.  Abdominal:     General: Abdomen is flat. Bowel sounds are normal.     Palpations: Abdomen is soft.     Tenderness: There is no abdominal tenderness. There is no right CVA tenderness or left CVA tenderness.  Skin:  General: Skin is warm.  Neurological:     Mental Status: He is alert and oriented to person, place, and time.  Psychiatric:        Mood and Affect: Mood normal.        Behavior: Behavior normal.      UC Treatments / Results  Labs (all labs ordered are listed, but only abnormal results are displayed) Labs Reviewed  CYTOLOGY, (ORAL, ANAL, URETHRAL) ANCILLARY ONLY    EKG   Radiology No results found.  Procedures Procedures (including critical care time)  Medications Ordered in UC Medications - No data to display  Initial Impression / Assessment and Plan / UC Course  I have reviewed the triage vital signs and the nursing notes.  Pertinent labs & imaging results that were available during my care of the patient were reviewed by me and considered in my medical decision making (see chart for details).    Final Clinical Impressions(s) / UC Diagnoses   Final diagnoses:  Contact with and (suspected) exposure to infections with a predominantly sexual mode of transmission     Discharge Instructions      You were tested for STIs today. You should receive  your results in 3-5 days. If you have not received a call from the office or see results in your mychart please call the clinic where you were seen. It is best if your refrain from sexual activity until you get results back. If positive you will need to refrain from sexual activity for 2 weeks and be sure to complete any antibiotics prescribed in their entirety.       ED Prescriptions     Medication Sig Dispense Auth. Provider   metroNIDAZOLE  (FLAGYL ) 500 MG tablet Take 1 tablet (500 mg total) by mouth 2 (two) times daily. 14 tablet Andra Corean BROCKS, PA-C      PDMP not reviewed this encounter.    [1]  Social History Tobacco Use   Smoking status: Former    Types: Cigars   Smokeless tobacco: Never  Vaping Use   Vaping status: Former  Substance Use Topics   Alcohol use: Yes    Comment: occasional   Drug use: Not Currently    Types: Marijuana     Andra Corean BROCKS, [place] 12/04/24 [phone]

## 2024-12-04 NOTE — Discharge Instructions (Addendum)
 You were tested for STIs today. You should receive your results in 3-5 days. If you have not received a call from the office or see results in your mychart please call the clinic where you were seen. It is best if your refrain from sexual activity until you get results back. If positive you will need to refrain from sexual activity for 2 weeks and be sure to complete any antibiotics prescribed in their entirety. '

## 2024-12-05 ENCOUNTER — Ambulatory Visit (HOSPITAL_COMMUNITY): Payer: Self-pay

## 2024-12-05 LAB — CYTOLOGY, (ORAL, ANAL, URETHRAL) ANCILLARY ONLY
Chlamydia: POSITIVE — AB
Comment: NEGATIVE
Comment: NEGATIVE
Comment: NORMAL
Neisseria Gonorrhea: NEGATIVE
Trichomonas: POSITIVE — AB

## 2024-12-05 MED ORDER — DOXYCYCLINE HYCLATE 100 MG PO TABS: 100.0000 mg | ORAL_TABLET | Freq: Two times a day (BID) | ORAL | 0 refills | Status: AC

## 2025-01-10 ENCOUNTER — Ambulatory Visit: Admission: EM | Admit: 2025-01-10 | Discharge: 2025-01-10 | Disposition: A | Discharge status: Home / Self Care

## 2025-01-10 ENCOUNTER — Encounter: Payer: Self-pay | Admitting: Emergency Medicine

## 2025-01-10 DIAGNOSIS — Z202 Contact with and (suspected) exposure to infections with a predominantly sexual mode of transmission: Secondary | ICD-10-CM | POA: Insufficient documentation

## 2025-01-10 NOTE — ED Provider Notes (Signed)
 " [name]    CSN: [id number] Arrival date & time: 01/10/25  0911      History   Chief Complaint Chief Complaint  Patient presents with   Exposure to STD    HPI Ryan Esparza is a 24 y.o. male.   Pt presents today for STI testing. Pt states that he tested positive for trich and chlamydia in December and he wants to be sure that symptoms have resolved. States that he did complete antibiotics and refrained from sexual activity for 2 weeks. Pt denies penile discharge, testicular pain, lower abdominal pain, or back pain.   The history is provided by the patient.  Exposure to STD    History reviewed. No pertinent past medical history.  Patient Active Problem List   Diagnosis Date Noted   Substance use disorder 09/11/2017   Bereavement 09/11/2017   Acne 09/13/2013    History reviewed. No pertinent surgical history.     Home Medications    Prior to Admission medications  Medication Sig Start Date End Date Taking? Authorizing Provider  metroNIDAZOLE  (FLAGYL ) 500 MG tablet Take 1 tablet (500 mg total) by mouth 2 (two) times daily. 12/04/24   Andra Corean BROCKS, PA-C    Family History Family History  Problem Relation Age of Onset   Obesity Father     Social History Social History[1]   Allergies   Patient has no known allergies.   Review of Systems Review of Systems   Physical Exam Triage Vital Signs ED Triage Vitals  Encounter Vitals Group     BP 01/10/25 1028 108/71     Girls Systolic BP Percentile --      Girls Diastolic BP Percentile --      Boys Systolic BP Percentile --      Boys Diastolic BP Percentile --      Pulse Rate 01/10/25 1028 (!) 55     Resp 01/10/25 1028 16     Temp 01/10/25 1028 97.6 F (36.4 C)     Temp Source 01/10/25 1028 Oral     SpO2 01/10/25 1028 98 %     Weight 01/10/25 1027 130 lb 1.1 oz (59 kg)     Height --      Head Circumference --      Peak Flow --      Pain Score 01/10/25 1027 0     Pain Loc --       Pain Education --      Exclude from Growth Chart --    No data found.  Updated Vital Signs BP 108/71 (BP Location: Left Arm)   Pulse (!) 55   Temp 97.6 F (36.4 C) (Oral)   Resp 16   Wt 130 lb 1.1 oz (59 kg)   SpO2 98%   BMI 21.64 kg/m   Visual Acuity Right Eye Distance:   Left Eye Distance:   Bilateral Distance:    Right Eye Near:   Left Eye Near:    Bilateral Near:     Physical Exam Vitals and nursing note reviewed.  Constitutional:      General: He is not in acute distress.    Appearance: Normal appearance. He is not ill-appearing, toxic-appearing or diaphoretic.  Eyes:     General: No scleral icterus. Cardiovascular:     Rate and Rhythm: Normal rate and regular rhythm.     Heart sounds: Normal heart sounds.  Pulmonary:     Effort: Pulmonary effort is normal. No respiratory distress.  Breath sounds: Normal breath sounds. No wheezing or rhonchi.  Abdominal:     General: Abdomen is flat. Bowel sounds are normal.     Palpations: Abdomen is soft.     Tenderness: There is no abdominal tenderness. There is no right CVA tenderness or left CVA tenderness.  Skin:    General: Skin is warm.  Neurological:     Mental Status: He is alert and oriented to person, place, and time.  Psychiatric:        Mood and Affect: Mood normal.        Behavior: Behavior normal.      UC Treatments / Results  Labs (all labs ordered are listed, but only abnormal results are displayed) Labs Reviewed  CYTOLOGY, (ORAL, ANAL, URETHRAL) ANCILLARY ONLY    EKG   Radiology No results found.  Procedures Procedures (including critical care time)  Medications Ordered in UC Medications - No data to display  Initial Impression / Assessment and Plan / UC Course  I have reviewed the triage vital signs and the nursing notes.  Pertinent labs & imaging results that were available during my care of the patient were reviewed by me and considered in my medical decision making (see  chart for details).     Final Clinical Impressions(s) / UC Diagnoses   Final diagnoses:  Contact with and (suspected) exposure to infections with a predominantly sexual mode of transmission     Discharge Instructions      We sent swab for Gonorrhea, Chlamydia, and Trich. Will call with results.     ED Prescriptions   None    PDMP not reviewed this encounter.    [1]  Social History Tobacco Use   Smoking status: Former    Types: Cigars    Passive exposure: Past   Smokeless tobacco: Never  Vaping Use   Vaping status: Former  Substance Use Topics   Alcohol use: Yes    Comment: occasional   Drug use: Not Currently    Types: Marijuana     Andra Corean BROCKS, PA-C 01/10/25 1119  "

## 2025-01-10 NOTE — Discharge Instructions (Signed)
 We sent swab for Gonorrhea, Chlamydia, and Trich. Will call with results.

## 2025-01-10 NOTE — ED Triage Notes (Signed)
 Pt presents requesting STD testing. Pt reports he is not having any sxs. Last sexual encounter about 1 week ago.

## 2025-01-11 LAB — CYTOLOGY, (ORAL, ANAL, URETHRAL) ANCILLARY ONLY
Chlamydia: NEGATIVE
Comment: NEGATIVE
Comment: NEGATIVE
Comment: NORMAL
Neisseria Gonorrhea: NEGATIVE
Trichomonas: NEGATIVE
